# Patient Record
Sex: Female | Born: 1965 | Race: White | Hispanic: No | Marital: Married | Smoking: Never smoker
Health system: Southern US, Community
[De-identification: ages and names within clinical notes are randomized; demographics above are authoritative.]

## PROBLEM LIST (undated history)

## (undated) DIAGNOSIS — C210 Malignant neoplasm of anus, unspecified: Secondary | ICD-10-CM

## (undated) DIAGNOSIS — C50919 Malignant neoplasm of unspecified site of unspecified female breast: Secondary | ICD-10-CM

## (undated) HISTORY — PX: OTHER SURGICAL HISTORY: SHX169

## (undated) HISTORY — DX: Malignant neoplasm of unspecified site of unspecified female breast: C50.919

## (undated) HISTORY — DX: Malignant neoplasm of anus, unspecified: C21.0

## (undated) HISTORY — PX: MASTECTOMY: SHX3

---

## 2005-11-02 DIAGNOSIS — N83209 Unspecified ovarian cyst, unspecified side: Secondary | ICD-10-CM | POA: Insufficient documentation

## 2005-11-02 DIAGNOSIS — M792 Neuralgia and neuritis, unspecified: Secondary | ICD-10-CM | POA: Insufficient documentation

## 2005-11-02 DIAGNOSIS — B081 Molluscum contagiosum: Secondary | ICD-10-CM | POA: Insufficient documentation

## 2006-04-26 DIAGNOSIS — C50919 Malignant neoplasm of unspecified site of unspecified female breast: Secondary | ICD-10-CM | POA: Insufficient documentation

## 2009-01-17 DIAGNOSIS — F419 Anxiety disorder, unspecified: Secondary | ICD-10-CM | POA: Insufficient documentation

## 2009-01-17 DIAGNOSIS — F32A Depression, unspecified: Secondary | ICD-10-CM | POA: Insufficient documentation

## 2009-04-17 DIAGNOSIS — M779 Enthesopathy, unspecified: Secondary | ICD-10-CM | POA: Insufficient documentation

## 2011-06-18 DIAGNOSIS — R87611 Atypical squamous cells cannot exclude high grade squamous intraepithelial lesion on cytologic smear of cervix (ASC-H): Secondary | ICD-10-CM | POA: Insufficient documentation

## 2012-09-22 DIAGNOSIS — L309 Dermatitis, unspecified: Secondary | ICD-10-CM | POA: Insufficient documentation

## 2014-03-22 DIAGNOSIS — Z9011 Acquired absence of right breast and nipple: Secondary | ICD-10-CM | POA: Insufficient documentation

## 2014-03-22 DIAGNOSIS — Z9882 Breast implant status: Secondary | ICD-10-CM | POA: Insufficient documentation

## 2016-03-12 DIAGNOSIS — L409 Psoriasis, unspecified: Secondary | ICD-10-CM | POA: Insufficient documentation

## 2017-09-23 DIAGNOSIS — B351 Tinea unguium: Secondary | ICD-10-CM | POA: Insufficient documentation

## 2020-08-05 DIAGNOSIS — H5712 Ocular pain, left eye: Secondary | ICD-10-CM | POA: Insufficient documentation

## 2020-08-05 DIAGNOSIS — B309 Viral conjunctivitis, unspecified: Secondary | ICD-10-CM | POA: Insufficient documentation

## 2021-01-05 DIAGNOSIS — C4492 Squamous cell carcinoma of skin, unspecified: Secondary | ICD-10-CM | POA: Insufficient documentation

## 2021-01-20 DIAGNOSIS — C210 Malignant neoplasm of anus, unspecified: Secondary | ICD-10-CM | POA: Insufficient documentation

## 2021-05-08 ENCOUNTER — Ambulatory Visit
Admission: RE | Admit: 2021-05-08 | Discharge: 2021-05-08 | Disposition: A | Discharge status: Home / Self Care | Payer: Self-pay | Source: Ambulatory Visit | Attending: Internal Medicine | Admitting: Internal Medicine

## 2021-05-08 ENCOUNTER — Encounter: Payer: Self-pay | Admitting: Internal Medicine

## 2021-05-08 ENCOUNTER — Other Ambulatory Visit: Payer: Self-pay

## 2021-05-08 ENCOUNTER — Inpatient Hospital Stay: Payer: BC Managed Care – PPO | Attending: Internal Medicine | Admitting: Internal Medicine

## 2021-05-08 ENCOUNTER — Inpatient Hospital Stay: Payer: BC Managed Care – PPO

## 2021-05-08 VITALS — BP 117/79 | HR 76 | Temp 99.7°F | Resp 18 | Wt 129.2 lb

## 2021-05-08 DIAGNOSIS — C210 Malignant neoplasm of anus, unspecified: Secondary | ICD-10-CM

## 2021-05-08 DIAGNOSIS — Z853 Personal history of malignant neoplasm of breast: Secondary | ICD-10-CM | POA: Diagnosis not present

## 2021-05-08 LAB — COMPREHENSIVE METABOLIC PANEL
ALT: 24 U/L (ref 0–44)
AST: 20 U/L (ref 15–41)
Albumin: 4 g/dL (ref 3.5–5.0)
Alkaline Phosphatase: 76 U/L (ref 38–126)
Anion gap: 7 (ref 5–15)
BUN: 15 mg/dL (ref 6–20)
CO2: 30 mmol/L (ref 22–32)
Calcium: 8.9 mg/dL (ref 8.9–10.3)
Chloride: 98 mmol/L (ref 98–111)
Creatinine, Ser: 0.44 mg/dL (ref 0.44–1.00)
GFR, Estimated: >60 mL/min (ref >60)
Glucose, Bld: 94 mg/dL (ref 70–99)
Potassium: 4.3 mmol/L (ref 3.5–5.1)
Sodium: 135 mmol/L (ref 135–145)
Total Bilirubin: 0.5 mg/dL (ref 0.3–1.2)
Total Protein: 7.5 g/dL (ref 6.5–8.1)

## 2021-05-08 LAB — CBC WITH DIFFERENTIAL/PLATELET
Abs Immature Granulocytes: 0.03 10*3/uL (ref 0.00–0.07)
Basophils Absolute: 0 10*3/uL (ref 0.0–0.1)
Basophils Relative: 1 %
Eosinophils Absolute: 0.3 10*3/uL (ref 0.0–0.5)
Eosinophils Relative: 6 %
HCT: 36.5 % (ref 36.0–46.0)
Hemoglobin: 12 g/dL (ref 12.0–15.0)
Immature Granulocytes: 1 %
Lymphocytes Relative: 13 %
Lymphs Abs: 0.6 10*3/uL — ABNORMAL LOW (ref 0.7–4.0)
MCH: 31.8 pg (ref 26.0–34.0)
MCHC: 32.9 g/dL (ref 30.0–36.0)
MCV: 96.8 fL (ref 80.0–100.0)
Monocytes Absolute: 1.2 10*3/uL — ABNORMAL HIGH (ref 0.1–1.0)
Monocytes Relative: 24 %
Neutro Abs: 2.7 10*3/uL (ref 1.7–7.7)
Neutrophils Relative %: 55 %
Platelets: 273 10*3/uL (ref 150–400)
RBC: 3.77 MIL/uL — ABNORMAL LOW (ref 3.87–5.11)
RDW: 17 % — ABNORMAL HIGH (ref 11.5–15.5)
WBC: 4.9 10*3/uL (ref 4.0–10.5)
nRBC: 0 % (ref 0.0–0.2)

## 2021-05-08 MED ORDER — PHENAZOPYRIDINE HCL 100 MG PO TABS: 100.0000 mg | ORAL_TABLET | Freq: Three times a day (TID) | ORAL | 1 refills | Status: DC

## 2021-05-08 MED ORDER — OXYCODONE HCL 5 MG PO TABS: 10.0000 mg | ORAL_TABLET | Freq: Every evening | ORAL | 0 refills | Status: DC

## 2021-05-08 MED ORDER — TRIAMCINOLONE ACETONIDE 0.5 % EX OINT: 1.0000 "application " | TOPICAL_OINTMENT | Freq: Two times a day (BID) | CUTANEOUS | 0 refills | Status: DC

## 2021-05-08 NOTE — Progress Notes (Signed)
Patient here to establish care for ana cancer. Pt recently moved from [place]. She completed radiation about 2 weeks ago and repots having painful itching to genital area.

## 2021-05-08 NOTE — Assessment & Plan Note (Addendum)
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  Finished radiation on Oct, 11th, 2022.  #As per history patient tolerated chemotherapy radiation-with expected side effects [see below]-no interruptions in therapy noted.  No hospitalizations noted.  Discussed that we will plan to get a PET scan about 3 months post radiation.  We will plan to get PET scan mid January 2022.  We will order PET scan next visit.   # Radiation dermatitis- G-3; continue aquaphor [allergy to Sulfa]; continue oxyccodne 10 mg nightly new prescription sent.  Also recommend Kenalog ointment twice a day; also recommend pyridium for dysuria.  #History of right breast cancer [2007-@ 39y;]- stage I ER/PR- her- 2- NEG; s/p mastecmy- no RT. no clinical evidence of recurrence.  BRCA negative as per patient; discuss genetics.  # Throid nodule-6 mm incidental [PET aug 2022]-await repeat PET imaging as above/consider biopsy.  #Port flush-at next visit; declines Emla cream.  # DISPOSITION: # labs- cbc/cmp today # follow up in 3-4 weeks- MD; labs- cbc/cmp/port flush- Dr.B  # 60 minutes face-to-face with the patient discussing the above plan of care; more than 50% of time spent on prognosis/ natural history; counseling and coordination.

## 2021-05-08 NOTE — Progress Notes (Signed)
[facility] OFFICE PROGRESS NOTE  No care team member to display  Cancer Staging No matching staging information was found for the patient.   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  Oncotype recurrence score 18; April 2007-tamoxifen plus Lupron.;  March 2008-CIN-1-total abdominal hysterectomy and bilateral salpingo-oophorectomy plus appendectomy.;  February 2008-Lupron discontinued; continue tamoxifen.  June 2017-2019-anastrozole; October 2019 stopped anastrozole-declining bone density.   Anal cancer (HCC)  05/08/2021 Initial Diagnosis   Anal cancer (HCC)      HPI:    Yolanda Huerta 55 y.o.  female pleasant patient above history of  newly diagnosed anal cancer is here to establish care.  Patient found a lump in the left groin July August 2022.  She went further evaluation with PET scan/biopsy-pathology as above.  Patient underwent concurrent chemoradiation as summarized above.  Patient finished radiation OCT 11th, 2022-.  Patient states that she worked through her treatments.  Complains of significant pain in the perineal region.  Difficulty with urination.  No flank pain or fevers.  Intermittent blood in stools.   Review of Systems  Constitutional:  Positive for malaise/fatigue. Negative for chills, diaphoresis, fever and weight loss.  HENT:  Negative for nosebleeds and sore throat.   Eyes:  Negative for double vision.  Respiratory:  Negative for cough, hemoptysis, sputum production, shortness of breath and  wheezing.   Cardiovascular:  Negative for chest pain, palpitations, orthopnea and leg swelling.  Gastrointestinal:  Positive for blood in stool. Negative for abdominal pain, constipation, diarrhea, heartburn, melena, nausea and vomiting.  Genitourinary:  Positive for dysuria, frequency and urgency.  Musculoskeletal:  Negative for back pain and joint pain.  Skin: Negative.  Negative for itching and rash.  Neurological:  Negative for dizziness, tingling, focal weakness, weakness and headaches.  Endo/Heme/Allergies:  Does not bruise/bleed easily.  Psychiatric/Behavioral:  Negative for depression. The patient is not nervous/anxious and does not have insomnia.      PAST MEDICAL HISTORY :  Past Medical History:  Diagnosis Date   Breast cancer (HCC)    age 39    PAST SURGICAL HISTORY :   Past Surgical History:  Procedure Laterality Date   complete hysterectomy     right mastectomy      FAMILY HISTORY :   Family History  Problem Relation Age of Onset   Hypertension Mother    Cancer Father        laryngeal cancer   Cancer Maternal Aunt        GYN   Colon cancer Maternal Aunt     SOCIAL HISTORY:   Social History   Tobacco Use   Smoking status: Never    Passive exposure: Never   Smokeless tobacco: Never  Vaping Use   Vaping Use: Never used  Substance Use Topics   Alcohol use: Not Currently   Drug use: Never    ALLERGIES:  has no allergies on file.  MEDICATIONS:  Current Outpatient Medications  Medication Sig Dispense Refill   cholecalciferol (VITAMIN D3) 25 MCG (1000 UNIT) tablet Take 1,000 Units by mouth daily.     pentoxifylline (TRENTAL) 400 MG CR tablet Take 400 mg   by mouth 3 (three) times daily.     phenazopyridine (PYRIDIUM) 100 MG tablet Take 1 tablet (100 mg total) by mouth 3 (three) times daily with meals. 30 tablet 1   triamcinolone ointment (KENALOG) 0.5 % Apply 1 application topically 2 (two) times daily. 30 g 0   venlafaxine (EFFEXOR) 75 MG tablet Take 75  mg by mouth daily.     Vitamin E 270 MG (600 UNIT) CAPS Take 3 capsules by mouth daily.     naloxone (NARCAN) nasal spray 4 mg/0.1 mL SMARTSIG:Both Nares (Patient not taking: Reported on 05/08/2021)     oxyCODONE (OXY IR/ROXICODONE) 5 MG immediate release tablet Take 2 tablets (10 mg total) by mouth at bedtime. 40 tablet 0   No current facility-administered medications for this visit.    PHYSICAL EXAMINATION: ECOG PERFORMANCE STATUS: 1 - Symptomatic but completely ambulatory  BP 117/79 (BP Location: Left Arm, Patient Position: Sitting)   Pulse 76   Temp 99.7 F (37.6 C)   Resp 18   Wt 129 lb 3.2 oz (58.6 kg)   Filed Weights   05/08/21 1406  Weight: 129 lb 3.2 oz (58.6 kg)   Examined presence of chaperone.  Skin exam/perineum-significant desquamation of the skin secondary radiation.  No signs of infection noted.  Physical Exam Vitals and nursing note reviewed.  HENT:     Head: Normocephalic and atraumatic.     Mouth/Throat:     Pharynx: Oropharynx is clear.  Eyes:     Extraocular Movements: Extraocular movements intact.     Pupils: Pupils are equal, round, and reactive to light.  Cardiovascular:     Rate and Rhythm: Normal rate and regular rhythm.  Abdominal:     Palpations: Abdomen is soft.  Musculoskeletal:        General: Normal range of motion.     Cervical back: Normal range of motion.  Skin:    General: Skin is warm.  Neurological:     General: No focal deficit present.     Mental Status: She is alert and oriented to person, place, and time.  Psychiatric:        Behavior: Behavior normal.        Judgment: Judgment normal.       LABORATORY DATA:  I have reviewed the data as listed No results found for: NA, K, CL, CO2, GLUCOSE, BUN, CREATININE, CALCIUM, PROT, ALBUMIN, AST, ALT, ALKPHOS, BILITOT, GFRNONAA, GFRAA  No results found for: SPEP, UPEP  No results found for: WBC, NEUTROABS, HGB, HCT, MCV, PLT    Chemistry   No results found for: NA, K, CL,  CO2, BUN, CREATININE, GLU No results found for: CALCIUM, ALKPHOS, AST, ALT, BILITOT     RADIOGRAPHIC STUDIES: I have personally reviewed the radiological images as listed and agreed with the findings in the report. No results found.   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  Finished radiation on Oct, 11th, 2022.  #As per history patient tolerated chemotherapy radiation-with expected side effects [see below]-no interruptions in therapy noted.  No hospitalizations noted.  Discussed that we will plan to get a PET scan about 3 months post radiation.  We will plan to get PET scan mid January 2022.  We will order PET scan next visit.   # Radiation dermatitis- G-3; continue aquaphor [allergy to Sulfa]; continue oxyccodne 10 mg nightly new prescription sent.  Also recommend   Kenalog ointment twice a day; also recommend pyridium for dysuria.  #History of right breast cancer [2007-@ 39y;]- stage I ER/PR- her- 2- NEG; s/p mastecmy- no RT. no clinical evidence of recurrence.  BRCA negative as per patient; discuss genetics.  # Throid nodule-6 mm incidental [PET aug 2022]-await repeat PET imaging as above/consider biopsy.  #Port flush-at next visit; declines Emla cream.  # DISPOSITION: # labs- cbc/cmp today # follow up in 3-4 weeks- MD; labs- cbc/cmp/port flush- Dr.B  # 60 minutes face-to-face with the patient discussing the above plan of care; more than 50% of time spent on prognosis/ natural history; counseling and coordination.   Orders Placed This Encounter  Procedures   CBC with Differential/Platelet    Standing Status:   Future    Number of Occurrences:   1    Standing Expiration Date:   05/08/2022   Comprehensive metabolic panel    Standing Status:   Future    Number of Occurrences:   1    Standing Expiration Date:   05/08/2022   CBC with  Differential/Platelet    Standing Status:   Future    Standing Expiration Date:   05/08/2022   Comprehensive metabolic panel    Standing Status:   Future    Standing Expiration Date:   05/08/2022   All questions were answered. The patient knows to call the clinic with any problems, questions or concerns.      Govinda R Brahmanday, MD 05/08/2021 3:19 PM   

## 2021-05-08 NOTE — Addendum Note (Signed)
Addended by: Vanice Sarah on: 05/08/2021 03:27 PM   Modules accepted: Orders

## 2021-05-11 ENCOUNTER — Other Ambulatory Visit: Payer: Self-pay | Admitting: *Deleted

## 2021-05-11 MED ORDER — PHENAZOPYRIDINE HCL 100 MG PO TABS: 100.0000 mg | ORAL_TABLET | Freq: Three times a day (TID) | ORAL | 1 refills | Status: AC

## 2021-05-11 MED ORDER — OXYCODONE HCL 5 MG PO TABS: 10.0000 mg | ORAL_TABLET | Freq: Every evening | ORAL | 0 refills | Status: DC

## 2021-05-11 MED ORDER — TRIAMCINOLONE ACETONIDE 0.5 % EX OINT: 1.0000 "application " | TOPICAL_OINTMENT | Freq: Two times a day (BID) | CUTANEOUS | 0 refills | Status: AC

## 2021-05-11 NOTE — Telephone Encounter (Signed)
Patient called and stated that the pharmacy did not receive any of these scripts-kenalog, pyridium and oxycodone.  Verified with pharmacy-that scripts were never rcvd.  Dr. B- pls resend scripts.

## 2021-05-29 ENCOUNTER — Inpatient Hospital Stay: Payer: BC Managed Care – PPO | Admitting: Internal Medicine

## 2021-05-29 ENCOUNTER — Inpatient Hospital Stay: Payer: BC Managed Care – PPO | Attending: Internal Medicine

## 2021-05-29 NOTE — Assessment & Plan Note (Deleted)
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  Finished radiation on Oct, 11th, 2022.  #As per history patient tolerated chemotherapy radiation-with expected side effects [see below]-no interruptions in therapy noted.  No hospitalizations noted.  Discussed that we will plan to get a PET scan about 3 months post radiation.  We will plan to get PET scan mid January 2022.  We will order PET scan next visit.   # Radiation dermatitis- G-3; continue aquaphor [allergy to Sulfa]; continue oxyccodne 10 mg nightly new prescription sent.  Also recommend Kenalog ointment twice a day; also recommend pyridium for dysuria.  #History of right breast cancer [2007-@ 39y;]- stage I ER/PR- her- 2- NEG; s/p mastecmy- no RT. no clinical evidence of recurrence.  BRCA negative as per patient; discuss genetics.  # Throid nodule-6 mm incidental [PET aug 2022]-await repeat PET imaging as above/consider biopsy.  #Port flush-at next visit; declines Emla cream.  # DISPOSITION: # labs- cbc/cmp today # follow up in 3-4 weeks- MD; labs- cbc/cmp/port flush- Dr.B  # 60 minutes face-to-face with the patient discussing the above plan of care; more than 50% of time spent on prognosis/ natural history; counseling and coordination.

## 2021-05-29 NOTE — Progress Notes (Deleted)
[place] [facility] OFFICE PROGRESS NOTE  No care team member to display   Cancer Staging  No matching staging information was found for the patient.   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  Oncotype recurrence score 18; April 2007-tamoxifen plus Lupron.;  March 2008-CIN-1-total abdominal hysterectomy and bilateral salpingo-oophorectomy plus appendectomy.;  February 2008-Lupron discontinued; continue tamoxifen.  June 2017-2019-anastrozole; October 2019 stopped anastrozole-declining bone density.   Anal cancer ([place])  05/08/2021 Initial Diagnosis   Anal cancer ([place])      HPI:    Yolanda Huerta 55 y.o.  female pleasant patient above history of  newly diagnosed anal cancer is here to establish care.  Patient found a lump in the left groin July August 2022.  She went further evaluation with PET scan/biopsy-pathology as above.  Patient underwent concurrent chemoradiation as summarized above.  Patient finished radiation OCT 11th, 2022-.  Patient states that she worked through her treatments.  Complains of significant pain in the perineal region.  Difficulty with urination.  No flank pain or fevers.  Intermittent blood in stools.   Review of Systems  Constitutional:  Positive for malaise/fatigue. Negative for chills, diaphoresis, fever and weight loss.  HENT:  Negative for nosebleeds and sore throat.   Eyes:  Negative for double vision.  Respiratory:  Negative for cough, hemoptysis, sputum production, shortness of breath and  wheezing.   Cardiovascular:  Negative for chest pain, palpitations, orthopnea and leg swelling.  Gastrointestinal:  Positive for blood in stool. Negative for abdominal pain, constipation, diarrhea, heartburn, melena, nausea and vomiting.  Genitourinary:  Positive for dysuria, frequency and urgency.  Musculoskeletal:  Negative for back pain and joint pain.  Skin: Negative.  Negative for itching and rash.  Neurological:  Negative for dizziness, tingling, focal weakness, weakness and headaches.  Endo/Heme/Allergies:  Does not bruise/bleed easily.  Psychiatric/Behavioral:  Negative for depression. The patient is not nervous/anxious and does not have insomnia.      PAST MEDICAL HISTORY :  Past Medical History:  Diagnosis Date  . Breast cancer [facility])    age 56    PAST SURGICAL HISTORY :   Past Surgical History:  Procedure Laterality Date  . complete hysterectomy    . right mastectomy      FAMILY HISTORY :   Family History  Problem Relation Age of Onset  . Hypertension Mother   . Cancer Father        laryngeal cancer  . Cancer Maternal Aunt        GYN  . Colon cancer Maternal Aunt     SOCIAL HISTORY:   Social History   Tobacco Use  . Smoking status: Never    Passive exposure: Never  . Smokeless tobacco: Never  Vaping Use  . Vaping Use: Never used  Substance Use Topics  . Alcohol use: Not Currently  . Drug use: Never    ALLERGIES:  has no allergies on file.  MEDICATIONS:  Current Outpatient Medications  Medication Sig Dispense Refill  . cholecalciferol (VITAMIN D3) 25 MCG (1000 UNIT) tablet Take 1,000 Units by mouth daily.    . naloxone (NARCAN) nasal spray 4 mg/0.1 mL  SMARTSIG:Both Nares (Patient not taking: Reported on 05/08/2021)    . oxyCODONE (OXY IR/ROXICODONE) 5 MG immediate release tablet Take 2 tablets (10 mg total) by mouth at bedtime. 40 tablet 0  . pentoxifylline (TRENTAL) 400 MG CR tablet Take 400 mg by mouth 3 (three) times daily.    . phenazopyridine  (PYRIDIUM) 100 MG tablet Take 1 tablet (100 mg total) by mouth 3 (three) times daily with meals. 30 tablet 1  . triamcinolone ointment (KENALOG) 0.5 % Apply 1 application topically 2 (two) times daily. 30 g 0  . venlafaxine (EFFEXOR) 75 MG tablet Take 75 mg by mouth daily.    . Vitamin E 270 MG (600 UNIT) CAPS Take 3 capsules by mouth daily.     No current facility-administered medications for this visit.    PHYSICAL EXAMINATION: ECOG PERFORMANCE STATUS: 1 - Symptomatic but completely ambulatory  There were no vitals taken for this visit.  There were no vitals filed for this visit.  Examined presence of chaperone.  Skin exam/perineum-significant desquamation of the skin secondary radiation.  No signs of infection noted.  Physical Exam Vitals and nursing note reviewed.  HENT:     Head: Normocephalic and atraumatic.     Mouth/Throat:     Pharynx: Oropharynx is clear.  Eyes:     Extraocular Movements: Extraocular movements intact.     Pupils: Pupils are equal, round, and reactive to light.  Cardiovascular:     Rate and Rhythm: Normal rate and regular rhythm.  Abdominal:     Palpations: Abdomen is soft.  Musculoskeletal:        General: Normal range of motion.     Cervical back: Normal range of motion.  Skin:    General: Skin is warm.  Neurological:     General: No focal deficit present.     Mental Status: She is alert and oriented to person, place, and time.  Psychiatric:        Behavior: Behavior normal.        Judgment: Judgment normal.       LABORATORY DATA:  I have reviewed the data as listed    Component Value Date/Time   NA 135 05/08/2021 1512   K 4.3 05/08/2021 1512   CL 98 05/08/2021 1512   CO2 30 05/08/2021 1512   GLUCOSE 94 05/08/2021 1512   BUN 15 05/08/2021 1512   CREATININE 0.44 05/08/2021 1512   CALCIUM 8.9 05/08/2021 1512   PROT 7.5 05/08/2021 1512   ALBUMIN 4.0 05/08/2021 1512   AST 20 05/08/2021 1512   ALT 24 05/08/2021 1512   ALKPHOS 76  05/08/2021 1512   BILITOT 0.5 05/08/2021 1512   GFRNONAA >60 05/08/2021 1512    No results found for: SPEP, UPEP  Lab Results  Component Value Date   WBC 4.9 05/08/2021   NEUTROABS 2.7 05/08/2021   HGB 12.0 05/08/2021   HCT 36.5 05/08/2021   MCV 96.8 05/08/2021   PLT 273 05/08/2021      Chemistry      Component Value Date/Time   NA 135 05/08/2021 1512   K 4.3 05/08/2021 1512   CL 98 05/08/2021 1512   CO2 30 05/08/2021 1512   BUN 15 05/08/2021 1512   CREATININE 0.44 05/08/2021 1512      Component Value Date/Time   CALCIUM 8.9 05/08/2021 1512   ALKPHOS 76 05/08/2021 1512   AST 20 05/08/2021 1512   ALT 24 05/08/2021 1512   BILITOT 0.5 05/08/2021 1512  RADIOGRAPHIC STUDIES: I have personally reviewed the radiological images as listed and agreed with the findings in the report. No results found.   ASSESSMENT & PLAN:  No problem-specific Assessment & Plan notes found for this encounter.   No orders of the defined types were placed in this encounter.  All questions were answered. The patient knows to call the clinic with any problems, questions or concerns.      Cammie Sickle, MD 05/29/2021 1:59 PM

## 2021-06-01 ENCOUNTER — Telehealth: Payer: Self-pay | Admitting: Internal Medicine

## 2021-06-01 NOTE — Telephone Encounter (Signed)
Pt called to set up an appt. Please give her a call back at [phone]

## 2021-06-01 NOTE — Telephone Encounter (Signed)
LVM for pt to call me back.

## 2021-06-09 NOTE — Telephone Encounter (Signed)
Patient left voicemail requesting a call to r/s missed appts.

## 2021-07-10 ENCOUNTER — Other Ambulatory Visit: Payer: Self-pay

## 2021-07-10 ENCOUNTER — Encounter: Payer: Self-pay | Admitting: Internal Medicine

## 2021-07-10 ENCOUNTER — Inpatient Hospital Stay: Payer: BC Managed Care – PPO | Attending: Internal Medicine

## 2021-07-10 ENCOUNTER — Inpatient Hospital Stay (HOSPITAL_BASED_OUTPATIENT_CLINIC_OR_DEPARTMENT_OTHER): Payer: BC Managed Care – PPO | Admitting: Internal Medicine

## 2021-07-10 DIAGNOSIS — Z9079 Acquired absence of other genital organ(s): Secondary | ICD-10-CM | POA: Diagnosis not present

## 2021-07-10 DIAGNOSIS — Z90710 Acquired absence of both cervix and uterus: Secondary | ICD-10-CM | POA: Insufficient documentation

## 2021-07-10 DIAGNOSIS — Z452 Encounter for adjustment and management of vascular access device: Secondary | ICD-10-CM | POA: Diagnosis present

## 2021-07-10 DIAGNOSIS — Z923 Personal history of irradiation: Secondary | ICD-10-CM | POA: Insufficient documentation

## 2021-07-10 DIAGNOSIS — C210 Malignant neoplasm of anus, unspecified: Secondary | ICD-10-CM | POA: Insufficient documentation

## 2021-07-10 DIAGNOSIS — Z9221 Personal history of antineoplastic chemotherapy: Secondary | ICD-10-CM | POA: Insufficient documentation

## 2021-07-10 DIAGNOSIS — Z90722 Acquired absence of ovaries, bilateral: Secondary | ICD-10-CM | POA: Diagnosis not present

## 2021-07-10 LAB — COMPREHENSIVE METABOLIC PANEL
ALT: 21 U/L (ref 0–44)
AST: 26 U/L (ref 15–41)
Albumin: 3.7 g/dL (ref 3.5–5.0)
Alkaline Phosphatase: 61 U/L (ref 38–126)
Anion gap: 7 (ref 5–15)
BUN: 18 mg/dL (ref 6–20)
CO2: 30 mmol/L (ref 22–32)
Calcium: 8.6 mg/dL — ABNORMAL LOW (ref 8.9–10.3)
Chloride: 101 mmol/L (ref 98–111)
Creatinine, Ser: 0.52 mg/dL (ref 0.44–1.00)
GFR, Estimated: >60 mL/min (ref >60)
Glucose, Bld: 93 mg/dL (ref 70–99)
Potassium: 3.8 mmol/L (ref 3.5–5.1)
Sodium: 138 mmol/L (ref 135–145)
Total Bilirubin: 0.6 mg/dL (ref 0.3–1.2)
Total Protein: 6.6 g/dL (ref 6.5–8.1)

## 2021-07-10 LAB — CBC WITH DIFFERENTIAL/PLATELET
Abs Immature Granulocytes: 0.01 10*3/uL (ref 0.00–0.07)
Basophils Absolute: 0 10*3/uL (ref 0.0–0.1)
Basophils Relative: 1 %
Eosinophils Absolute: 0.1 10*3/uL (ref 0.0–0.5)
Eosinophils Relative: 4 %
HCT: 38.1 % (ref 36.0–46.0)
Hemoglobin: 12.3 g/dL (ref 12.0–15.0)
Immature Granulocytes: 0 %
Lymphocytes Relative: 27 %
Lymphs Abs: 0.8 10*3/uL (ref 0.7–4.0)
MCH: 32.4 pg (ref 26.0–34.0)
MCHC: 32.3 g/dL (ref 30.0–36.0)
MCV: 100.3 fL — ABNORMAL HIGH (ref 80.0–100.0)
Monocytes Absolute: 0.5 10*3/uL (ref 0.1–1.0)
Monocytes Relative: 17 %
Neutro Abs: 1.5 10*3/uL — ABNORMAL LOW (ref 1.7–7.7)
Neutrophils Relative %: 51 %
Platelets: 202 10*3/uL (ref 150–400)
RBC: 3.8 MIL/uL — ABNORMAL LOW (ref 3.87–5.11)
RDW: 14.6 % (ref 11.5–15.5)
WBC: 2.9 10*3/uL — ABNORMAL LOW (ref 4.0–10.5)
nRBC: 0 % (ref 0.0–0.2)

## 2021-07-10 MED ORDER — HEPARIN SOD (PORK) LOCK FLUSH 100 UNIT/ML IV SOLN
500.0000 [IU] | Freq: Once | INTRAVENOUS | Status: AC
  Administered 2021-07-10: 11:00:00 500 [IU] via INTRAVENOUS
  Filled 2021-07-10: qty 5

## 2021-07-10 MED ORDER — SODIUM CHLORIDE 0.9% FLUSH
10.0000 mL | Freq: Once | INTRAVENOUS | Status: AC
  Administered 2021-07-10: 11:00:00 10 mL via INTRAVENOUS
  Filled 2021-07-10: qty 10

## 2021-07-10 NOTE — Progress Notes (Signed)
[facility] OFFICE PROGRESS NOTE  No care team member to display   Cancer Staging  No matching staging information was found for the patient.   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  Oncotype recurrence score 18; April 2007-tamoxifen plus Lupron.;  March 2008-CIN-1-total abdominal hysterectomy and bilateral salpingo-oophorectomy plus appendectomy.;  February 2008-Lupron discontinued; continue tamoxifen.  June 2017-2019-anastrozole; October 2019 stopped anastrozole-declining bone density.   Anal cancer ([place])  05/08/2021 Initial Diagnosis   Anal cancer ([place])      HPI: Alone.  Walking independently  Yolanda Huerta 55 y.o.  female pleasant patient above history of  anal cancer stage III status post chemoradiation is here for follow-up  Patient finished radiation OCT 11th, 2022-in [place].  Patient also has significant improvement of her diarrhea.  However continues to have intermittent loose stools.  Also has significant improvement of the pain from radiation in the perineal region.  Intermittent difficulty with urination ; however significant improved.  Complains of painful coitus.    Review of Systems  Constitutional:  Positive for malaise/fatigue. Negative for chills, diaphoresis, fever and weight loss.  HENT:  Negative for nosebleeds and sore throat.   Eyes:  Negative for double vision.  Respiratory:  Negative for cough, hemoptysis, sputum production, shortness of breath and wheezing.   Cardiovascular:  Negative  for chest pain, palpitations, orthopnea and leg swelling.  Gastrointestinal:  Negative for abdominal pain, constipation, diarrhea, heartburn, melena, nausea and vomiting.  Genitourinary:  Positive for dysuria.  Musculoskeletal:  Negative for back pain and joint pain.  Skin: Negative.  Negative for itching and rash.  Neurological:  Negative for dizziness, tingling, focal weakness, weakness and headaches.  Endo/Heme/Allergies:  Does not bruise/bleed easily.  Psychiatric/Behavioral:  Negative for depression. The patient is not nervous/anxious and does not have insomnia.      PAST MEDICAL HISTORY :  Past Medical History:  Diagnosis Date   Breast cancer [facility])    age 25    PAST SURGICAL HISTORY :   Past Surgical History:  Procedure Laterality Date   complete hysterectomy     right mastectomy      FAMILY HISTORY :   Family History  Problem Relation Age of Onset   Hypertension Mother    Cancer Father        laryngeal cancer   Cancer Maternal Aunt        GYN   Colon cancer Maternal Aunt     SOCIAL HISTORY:   Social History   Tobacco Use   Smoking status: Never    Passive exposure: Never   Smokeless tobacco: Never  Vaping Use   Vaping Use: Never used  Substance Use Topics   Alcohol use: Not Currently   Drug use: Never    ALLERGIES:  is allergic to sulfa antibiotics.  MEDICATIONS:  Current Outpatient Medications  Medication Sig Dispense Refill   cholecalciferol (VITAMIN D3) 25 MCG (1000 UNIT) tablet Take 1,000 Units by mouth daily.     venlafaxine (EFFEXOR) 75 MG tablet Take 75 mg by mouth daily.     Vitamin E 270 MG (600 UNIT) CAPS Take 3 capsules by mouth  daily.     pentoxifylline (TRENTAL) 400 MG CR tablet Take 400 mg by mouth 3 (three) times daily. (Patient not taking: Reported on 07/10/2021)     phenazopyridine (PYRIDIUM) 100 MG tablet Take 1 tablet (100 mg total) by mouth 3 (three) times daily with meals. (Patient not taking: Reported on 07/10/2021) 30 tablet 1    triamcinolone ointment (KENALOG) 0.5 % Apply 1 application topically 2 (two) times daily. (Patient not taking: Reported on 07/10/2021) 30 g 0   No current facility-administered medications for this visit.    PHYSICAL EXAMINATION: ECOG PERFORMANCE STATUS: 1 - Symptomatic but completely ambulatory  BP 110/83 (BP Location: Left Arm, Patient Position: Sitting, Cuff Size: Normal)    Pulse 72    Temp 97.8 F (36.6 C) (Tympanic)    Wt 136 lb 3.2 oz (61.8 kg)    SpO2 99%   Filed Weights   07/10/21 0956  Weight: 136 lb 3.2 oz (61.8 kg)    Physical Exam Vitals and nursing note reviewed.  HENT:     Head: Normocephalic and atraumatic.     Mouth/Throat:     Pharynx: Oropharynx is clear.  Eyes:     Extraocular Movements: Extraocular movements intact.     Pupils: Pupils are equal, round, and reactive to light.  Cardiovascular:     Rate and Rhythm: Normal rate and regular rhythm.  Abdominal:     Palpations: Abdomen is soft.  Musculoskeletal:        General: Normal range of motion.     Cervical back: Normal range of motion.  Skin:    General: Skin is warm.  Neurological:     General: No focal deficit present.     Mental Status: She is alert and oriented to person, place, and time.  Psychiatric:        Behavior: Behavior normal.        Judgment: Judgment normal.       LABORATORY DATA:  I have reviewed the data as listed    Component Value Date/Time   NA 138 07/10/2021 0944   K 3.8 07/10/2021 0944   CL 101 07/10/2021 0944   CO2 30 07/10/2021 0944   GLUCOSE 93 07/10/2021 0944   BUN 18 07/10/2021 0944   CREATININE 0.52 07/10/2021 0944   CALCIUM 8.6 (L) 07/10/2021 0944   PROT 6.6 07/10/2021 0944   ALBUMIN 3.7 07/10/2021 0944   AST 26 07/10/2021 0944   ALT 21 07/10/2021 0944   ALKPHOS 61 07/10/2021 0944   BILITOT 0.6 07/10/2021 0944   GFRNONAA >60 07/10/2021 0944    No results found for: SPEP, UPEP  Lab Results  Component Value Date   WBC 2.9 (L) 07/10/2021   NEUTROABS  1.5 (L) 07/10/2021   HGB 12.3 07/10/2021   HCT 38.1 07/10/2021   MCV 100.3 (H) 07/10/2021   PLT 202 07/10/2021      Chemistry      Component Value Date/Time   NA 138 07/10/2021 0944   K 3.8 07/10/2021 0944   CL 101 07/10/2021 0944   CO2 30 07/10/2021 0944   BUN 18 07/10/2021 0944   CREATININE 0.52 07/10/2021 0944      Component Value Date/Time   CALCIUM 8.6 (L) 07/10/2021 0944   ALKPHOS 61 07/10/2021 0944   AST 26 07/10/2021 0944   ALT 21 07/10/2021 0944   BILITOT 0.6 07/10/2021 0944       RADIOGRAPHIC STUDIES: I have personally reviewed the radiological images as listed and agreed with the  findings in the report. No results found.   ASSESSMENT & PLAN:  Anal cancer ([place]) #Anal cancer stage III [cT1cN1; [place]]-s/p concurrent chemoradiation with continuous 5-FU infusion plus mitomycin day 1 day 29.  Finished radiation on Oct, 11th, 2022.  # plan get PET scan in 3 weeks [mid to late JAN 2023.]; ie- PET scan about 3 months post radiation. Order PET scan today  # Radiation dermatitis- resolved.  STOP pyridium/trentral.   # Sexual dysfunction/painful coitus- recommend eva with [facility]- dsicussed with Lauren.   # Throid nodule-6 mm incidental [PET aug 2022]-await repeat PET imaging as above/consider biopsy.  #Port flush-today; declines Emla cream.  # DISPOSITION: # port flush today # follow up in 3- weeks- MD; PET scan proir- Dr.B     Orders Placed This Encounter  Procedures   NM PET Image Restage (PS) Skull Base to Thigh (F-18 FDG)    Standing Status:   Future    Standing Expiration Date:   07/10/2022    Order Specific Question:   If indicated for the ordered procedure, I authorize the administration of a radiopharmaceutical per Radiology protocol    Answer:   Yes    Order Specific Question:   Preferred imaging location?    Answer:   [facility]    Order Specific Question:   Is the patient pregnant?    Answer:   No   All questions were answered.  The patient knows to call the clinic with any problems, questions or concerns.      Cammie Sickle, MD 07/10/2021 11:22 AM

## 2021-07-10 NOTE — Assessment & Plan Note (Addendum)
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  Finished radiation on Oct, 11th, 2022.  # plan get PET scan in 3 weeks [mid to late JAN 2023.]; ie- PET scan about 3 months post radiation. Order PET scan today  # Radiation dermatitis- resolved.  STOP pyridium/trentral.   # Sexual dysfunction/painful coitus- recommend eva with [facility]- dsicussed with Lauren.   # Throid nodule-6 mm incidental [PET aug 2022]-await repeat PET imaging as above/consider biopsy.  #Port flush-today; declines Emla cream.  # DISPOSITION: # port flush today # follow up in 3- weeks- MD; PET scan proir- Dr.B

## 2021-07-16 ENCOUNTER — Telehealth: Payer: BC Managed Care – PPO | Admitting: Nurse Practitioner

## 2021-07-17 ENCOUNTER — Other Ambulatory Visit: Payer: Self-pay

## 2021-07-17 ENCOUNTER — Inpatient Hospital Stay (HOSPITAL_BASED_OUTPATIENT_CLINIC_OR_DEPARTMENT_OTHER): Payer: BC Managed Care – PPO | Admitting: Nurse Practitioner

## 2021-07-17 NOTE — Progress Notes (Signed)
Patient did not answer for appt. Will have scheduling contact her to reschedule.

## 2021-07-27 ENCOUNTER — Telehealth: Payer: Self-pay | Admitting: Internal Medicine

## 2021-07-27 NOTE — Telephone Encounter (Signed)
Pt called to reschedule her appt for 1-27. Call back at [phone]

## 2021-07-28 ENCOUNTER — Other Ambulatory Visit: Payer: Self-pay | Admitting: Internal Medicine

## 2021-07-28 ENCOUNTER — Telehealth: Payer: Self-pay

## 2021-07-28 ENCOUNTER — Telehealth: Payer: Self-pay | Admitting: Internal Medicine

## 2021-07-28 DIAGNOSIS — C210 Malignant neoplasm of anus, unspecified: Secondary | ICD-10-CM

## 2021-07-28 NOTE — Telephone Encounter (Signed)
Pt called to reschedule her appts on 1-27. Ahe needs a Friday appt. Call back at [phone]

## 2021-07-28 NOTE — Progress Notes (Signed)
Message on patient's voicemail as well as a MyChart message informing patient of the need to cancel PET and schedule CT.

## 2021-07-28 NOTE — Telephone Encounter (Signed)
Message on patient's voicemail informing her that insurance denied prior auth on PET scan with request for more recent CT scan.  PET has been cancelled.  Please schedule STAT CT as MD has ordered and inform patient of CT appt date.

## 2021-07-28 NOTE — Telephone Encounter (Signed)
My Chart message also sent to patient.

## 2021-07-28 NOTE — Progress Notes (Signed)
Spoke to P2P-recommend CT chest and pelvis with contrast-if inconclusive then would approve PET scan.  CT chest and pelvis ordered stat.  Please inform pt of above changes.   Thanks GB

## 2021-07-29 ENCOUNTER — Encounter: Payer: Self-pay | Admitting: Internal Medicine

## 2021-07-29 ENCOUNTER — Telehealth: Payer: Self-pay | Admitting: Internal Medicine

## 2021-07-29 ENCOUNTER — Other Ambulatory Visit: Payer: BC Managed Care – PPO

## 2021-07-29 NOTE — Telephone Encounter (Signed)
[place] insurance authorization department verified with insurance company that PET was denied and CT was approved.  Patient would like to speak with Dr. B regarding this matter.

## 2021-07-29 NOTE — Telephone Encounter (Signed)
Pt called and needs to get PET scan appt back for 1-19. There was an insurance problem that has been taken care of. Call back at [phone]- cell, or work -[phone] confirm appt.

## 2021-07-29 NOTE — Progress Notes (Signed)
I spoke to patient regarding her concerns for pet scan. Confirm with the patient that pet scan was not approved by insurance. If inconclusive on a CT scan would recommend a PET SCAN. And also discuss regarding valuation with [facility]surgery regarding endoscopy/biopsy if needed.  Regards of thyroid abnormality, I would discuss ultrasound/biopsy at next week.  Follow up with me after the CT scan As planned  GB

## 2021-07-30 ENCOUNTER — Ambulatory Visit
Admission: RE | Admit: 2021-07-30 | Discharge: 2021-07-30 | Disposition: A | Discharge status: Home / Self Care | Payer: BC Managed Care – PPO | Source: Ambulatory Visit | Attending: Internal Medicine | Admitting: Internal Medicine

## 2021-07-30 ENCOUNTER — Other Ambulatory Visit: Payer: Self-pay

## 2021-07-30 DIAGNOSIS — C210 Malignant neoplasm of anus, unspecified: Secondary | ICD-10-CM | POA: Diagnosis not present

## 2021-07-30 MED ORDER — IOHEXOL 300 MG/ML  SOLN
100.0000 mL | Freq: Once | INTRAMUSCULAR | Status: AC | PRN
  Administered 2021-07-30: 100 mL via INTRAVENOUS

## 2021-07-31 ENCOUNTER — Other Ambulatory Visit: Payer: Self-pay | Admitting: Internal Medicine

## 2021-07-31 ENCOUNTER — Ambulatory Visit: Payer: BC Managed Care – PPO | Admitting: Internal Medicine

## 2021-07-31 ENCOUNTER — Encounter: Payer: Self-pay | Admitting: Internal Medicine

## 2021-07-31 ENCOUNTER — Other Ambulatory Visit: Payer: Self-pay

## 2021-07-31 DIAGNOSIS — C210 Malignant neoplasm of anus, unspecified: Secondary | ICD-10-CM

## 2021-07-31 NOTE — Progress Notes (Signed)
Per Montel Culver Auth# [id number] valid 07/31/21-08/29/21 for [id number]

## 2021-07-31 NOTE — Progress Notes (Signed)
Ordered STAT PET scan. GB

## 2021-07-31 NOTE — Progress Notes (Signed)
Referral faxed to [facility] de[facility]. P: [phone] F: [phone]

## 2021-07-31 NOTE — Progress Notes (Signed)
GB-I ordered a stat PET scan-as her CT scan was inconclusive.  Also please make a referral to [facility] clinic surgery re: anal cancer/re-evaluation. Thanks

## 2021-07-31 NOTE — Progress Notes (Signed)
Spoke to patient regarding results of the CAT scan. Would recommend PET scan. Also discuss with Radiology.

## 2021-08-03 ENCOUNTER — Telehealth: Payer: Self-pay | Admitting: Internal Medicine

## 2021-08-03 NOTE — Telephone Encounter (Signed)
Pt called back and is requesting for the PET scan to be moved to March 2nd, 2023. Pt states she just started a new job and her employer is giving her a hard time. The patient doesn't want to lose her employment. She also wants her follow-up with Dr. B canceled on 1/27. Please advise on rescheduled and notify the patient of the changes.

## 2021-08-03 NOTE — Telephone Encounter (Signed)
Called patient to notify her that the PET scan has been scheduled for 08/05/2021 @ 10:30. Pt did not answer so VM was left with appt details. Pt was asked to call to confirm.

## 2021-08-04 NOTE — Progress Notes (Signed)
Called [facility] to confirm receipt of referral.  There referral was faxed to wrong dept but was transferred to the correct office ([phone]).  [facility] has left a message with patient to call their office to schedule appointment.

## 2021-08-07 ENCOUNTER — Other Ambulatory Visit: Payer: Self-pay

## 2021-08-07 ENCOUNTER — Ambulatory Visit: Payer: BC Managed Care – PPO | Admitting: Internal Medicine

## 2021-08-07 ENCOUNTER — Encounter
Admission: RE | Admit: 2021-08-07 | Discharge: 2021-08-07 | Disposition: A | Discharge status: Home / Self Care | Payer: BC Managed Care – PPO | Source: Ambulatory Visit | Attending: Internal Medicine | Admitting: Internal Medicine

## 2021-08-07 ENCOUNTER — Ambulatory Visit: Payer: BC Managed Care – PPO | Admitting: Nurse Practitioner

## 2021-08-07 DIAGNOSIS — C210 Malignant neoplasm of anus, unspecified: Secondary | ICD-10-CM | POA: Insufficient documentation

## 2021-08-07 LAB — GLUCOSE, CAPILLARY: Glucose-Capillary: 88 mg/dL (ref 70–99)

## 2021-08-07 MED ORDER — FLUDEOXYGLUCOSE F - 18 (FDG) INJECTION
7.0700 | Freq: Once | INTRAVENOUS | Status: AC | PRN
  Administered 2021-08-07: 7.07 via INTRAVENOUS

## 2021-08-11 ENCOUNTER — Telehealth: Payer: Self-pay | Admitting: Nurse Practitioner

## 2021-08-11 NOTE — Telephone Encounter (Signed)
Pt called to reschedule her appt for 2-3. Call back at [phone]

## 2021-08-14 ENCOUNTER — Other Ambulatory Visit: Payer: Self-pay

## 2021-08-14 ENCOUNTER — Inpatient Hospital Stay: Payer: BC Managed Care – PPO | Attending: Internal Medicine | Admitting: Nurse Practitioner

## 2021-08-14 ENCOUNTER — Inpatient Hospital Stay: Payer: BC Managed Care – PPO | Admitting: Nurse Practitioner

## 2021-08-14 VITALS — BP 129/83 | HR 77 | Temp 97.5°F | Resp 16 | Wt 133.4 lb

## 2021-08-14 DIAGNOSIS — R102 Pelvic and perineal pain: Secondary | ICD-10-CM | POA: Diagnosis not present

## 2021-08-14 DIAGNOSIS — C210 Malignant neoplasm of anus, unspecified: Secondary | ICD-10-CM | POA: Diagnosis not present

## 2021-08-14 DIAGNOSIS — M62838 Other muscle spasm: Secondary | ICD-10-CM

## 2021-08-14 DIAGNOSIS — Z08 Encounter for follow-up examination after completed treatment for malignant neoplasm: Secondary | ICD-10-CM | POA: Diagnosis not present

## 2021-08-14 DIAGNOSIS — Z85048 Personal history of other malignant neoplasm of rectum, rectosigmoid junction, and anus: Secondary | ICD-10-CM | POA: Diagnosis not present

## 2021-08-14 NOTE — Progress Notes (Signed)
[facility] OFFICE PROGRESS NOTE  Patient Care Team: Pcp, No as PCP - General   Cancer Staging  No matching staging information was found for the patient.   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  Oncotype recurrence score 18; April 2007-tamoxifen plus Lupron.;  March 2008-CIN-1-total abdominal hysterectomy and bilateral salpingo-oophorectomy plus appendectomy.;  February 2008-Lupron discontinued; continue tamoxifen.  June 2017-2019-anastrozole; October 2019 stopped anastrozole-declining bone density.   Anal cancer ([place])  05/08/2021 Initial Diagnosis   Anal cancer ([place])      HPI: Alone.  Walking independently  Yolanda Huerta 56 y.o.  female pleasant patient above history of  anal cancer stage III status post chemoradiation is here for follow-up.   Patient finished radiation OCT 11th, 2022-in [place]. Reports ongoing painful intercourse. Husband accompanies her today. Describes significant pain after insertion. Has tried vaginal dilators without improvement in symptoms. Otherwise she feels well. Denies vaginal dryness. Denies painful bowel movements.    Review of Systems  Constitutional:  Negative for chills, diaphoresis, fever, malaise/fatigue and weight loss.  HENT:  Negative for nosebleeds and sore throat.   Eyes:  Negative for double vision.  Respiratory:  Negative for cough, hemoptysis, sputum production, shortness of breath and wheezing.   Cardiovascular:  Negative for chest pain, palpitations, orthopnea and  leg swelling.  Gastrointestinal:  Negative for abdominal pain, constipation, diarrhea, heartburn, melena, nausea and vomiting.  Genitourinary:  Negative for dysuria and urgency.  Musculoskeletal:  Negative for back pain and joint pain.  Skin: Negative.  Negative for itching and rash.  Neurological:  Negative for dizziness, tingling, focal weakness, weakness and headaches.  Endo/Heme/Allergies:  Does not bruise/bleed easily.  Psychiatric/Behavioral:  Negative for depression. The patient is not nervous/anxious and does not have insomnia.      PAST MEDICAL HISTORY :  Past Medical History:  Diagnosis Date   Breast cancer [facility])    age 56    PAST SURGICAL HISTORY :   Past Surgical History:  Procedure Laterality Date   complete hysterectomy     right mastectomy      FAMILY HISTORY :   Family History  Problem Relation Age of Onset   Hypertension Mother    Cancer Father        laryngeal cancer   Cancer Maternal Aunt        GYN   Colon cancer Maternal Aunt     SOCIAL HISTORY:   Social History   Tobacco Use   Smoking status: Never    Passive exposure: Never   Smokeless tobacco: Never  Vaping Use   Vaping Use: Never used  Substance Use Topics   Alcohol use: Not Currently   Drug use: Never    ALLERGIES:  is allergic to sulfa antibiotics.  MEDICATIONS:  Current Outpatient Medications  Medication Sig Dispense Refill   cholecalciferol (VITAMIN D3) 25 MCG (1000 UNIT) tablet Take 1,000 Units by mouth daily.     venlafaxine (EFFEXOR) 75 MG tablet Take 75 mg by mouth daily.     pentoxifylline (TRENTAL) 400 MG CR tablet Take 400 mg by mouth 3 (three) times daily. (Patient not taking: Reported on 07/10/2021)  phenazopyridine (PYRIDIUM) 100 MG tablet Take 1 tablet (100 mg total) by mouth 3 (three) times daily with meals. (Patient not taking: Reported on 07/10/2021) 30 tablet 1   triamcinolone ointment (KENALOG) 0.5 % Apply 1 application topically 2 (two) times daily. (Patient  not taking: Reported on 07/10/2021) 30 g 0   Vitamin E 270 MG (600 UNIT) CAPS Take 3 capsules by mouth daily. (Patient not taking: Reported on 08/14/2021)     No current facility-administered medications for this visit.    PHYSICAL EXAMINATION: ECOG PERFORMANCE STATUS: 1 - Symptomatic but completely ambulatory  BP 129/83    Pulse 77    Temp (!) 97.5 F (36.4 C)    Resp 16    Wt 133 lb 6.4 oz (60.5 kg)    SpO2 98%   Filed Weights   08/14/21 1304  Weight: 133 lb 6.4 oz (60.5 kg)    Physical Exam Vitals and nursing note reviewed.  HENT:     Head: Normocephalic and atraumatic.     Mouth/Throat:     Pharynx: Oropharynx is clear.  Eyes:     Extraocular Movements: Extraocular movements intact.     Pupils: Pupils are equal, round, and reactive to light.  Cardiovascular:     Rate and Rhythm: Normal rate and regular rhythm.  Abdominal:     Palpations: Abdomen is soft.  Musculoskeletal:        General: Normal range of motion.     Cervical back: Normal range of motion.  Skin:    General: Skin is warm.  Neurological:     General: No focal deficit present.     Mental Status: She is alert and oriented to person, place, and time.  Psychiatric:        Behavior: Behavior normal.        Judgment: Judgment normal.   Pelvic: Exam Chaperoned by Zaria/CMA.  EGBUS: no lesions. Mildly atrophic. Speculum exam deferred d/t pain. BME: no agglutination. Approximately 5 cm into vagina a taut band of tissue was palpated and patient reports as area of pain. Able to palpate higher into the vagina and no masses or other painful areas noted. Vaginal length is preserved.    LABORATORY DATA:  I have reviewed the data as listed    Component Value Date/Time   NA 138 07/10/2021 0944   K 3.8 07/10/2021 0944   CL 101 07/10/2021 0944   CO2 30 07/10/2021 0944   GLUCOSE 93 07/10/2021 0944   BUN 18 07/10/2021 0944   CREATININE 0.52 07/10/2021 0944   CALCIUM 8.6 (L) 07/10/2021 0944   PROT 6.6 07/10/2021  0944   ALBUMIN 3.7 07/10/2021 0944   AST 26 07/10/2021 0944   ALT 21 07/10/2021 0944   ALKPHOS 61 07/10/2021 0944   BILITOT 0.6 07/10/2021 0944   GFRNONAA >60 07/10/2021 0944    No results found for: SPEP, UPEP  Lab Results  Component Value Date   WBC 2.9 (L) 07/10/2021   NEUTROABS 1.5 (L) 07/10/2021   HGB 12.3 07/10/2021   HCT 38.1 07/10/2021   MCV 100.3 (H) 07/10/2021   PLT 202 07/10/2021      Chemistry      Component Value Date/Time   NA 138 07/10/2021 0944   K 3.8 07/10/2021 0944   CL 101 07/10/2021 0944   CO2 30 07/10/2021 0944   BUN 18 07/10/2021 0944   CREATININE 0.52 07/10/2021 0944      Component Value Date/Time   CALCIUM 8.6 (L) 07/10/2021 0944  ALKPHOS 61 07/10/2021 0944   AST 26 07/10/2021 0944   ALT 21 07/10/2021 0944   BILITOT 0.6 07/10/2021 0944       RADIOGRAPHIC STUDIES: I have personally reviewed the radiological images as listed and agreed with the findings in the report. No results found.   ASSESSMENT & PLAN:  No problem-specific Assessment & Plan notes found for this encounter. #Anal cancer stage III [cT1cN1; [place]]-s/p concurrent chemoradiation with continuous 5-FU infusion plus mitomycin day 1 day 29.  Finished radiation on Oct, 11th, 2022.   # I independently reviewed PET scan from 08/07/21 and agree with findings- Previously, hypermetabolic anal activity has resolved in additional to inguinal lymph node hypermetabolic activity. No other evidence of local or distant recurrence. Reviewed NCCN surveillance guidelines including history of physical exam inclujding digital rectal exam in 8-12 weeks then DRE every 3-6 months for 5 years with inguinal lymph nodes, anoscopy x 3 years, and imaging.    # Radiation dermatitis- resolved. STOP pyridium/trentral.    # Sexual dysfunction/painful coitus/Levator spasm/Pelvic pain syndrome- suspect she is experiencing levator spasms and trigger point based on exam and clinical history. This is  also why she is experiencing a constellation of other pelvic symptoms from urinary to rectal. We reviewed anatomy of pelvic floor and how this is related to treatment that she underwent for cancer. I provided her with a medium sized vaginal dilator. Recommended she use ample lubricant and can eventually perform vaginal dilator exercises once or twice a day several times a week to keep vaginal open. However, I reviewed that she would benefit most from pelvic floor physical therapy which may include external and internal PT. Reviewed that it is reassuring that her vagina has not agglutinated and she has preserved vaginal length.   # Hypoestrogenism/Vaginal atrophy- mild. She has a history of ER/PR positive breast cancer so I would not recommend vaginal estrogen. We reviewed that there is less systemic absorption with these formulations, however, risk may outweigh benefit. We can re-evaluate in the future if she does not get significant relief from other modalities.   # HSIL- s/p RR-TAH-BSO (breast cancer - see below). Last pap in 2015 was negative. NILM and possibly HPV positive 01/22/21 by Dr. Asencion Gowda Lim/gyn onc. Given history of SCC of anus she needs a vaginal pap. I can see her back in the future to perform this or can refer her to gynecology locally. Discuss at next visit. Speculum exam was not performed today d/t pain/spasm.    # Throid nodule-6 mm incidental [PET aug 2022]-await repeat PET imaging as above/consider biopsy.  # Hx of right breast cancer- s/p mastectomy February 2007. Oncotype recurrence score 18; April 2007-tamoxifen plus Lupron.; March 2008-CIN-1-total abdominal hysterectomy and bilateral salpingo-oophorectomy plus appendectomy.; February 2008-Lupron discontinued; continue tamoxifen. June 2017-2019-anastrozole; October 2019 stopped anastrozole d/t declining bone density.  # Bone density- discuss bone density at next visit or refer to pcp.    #Port flush- every 8-12 weeks.   #  DISPOSITION: Referral to physical therapy (provided [facility] referral and [facility] (self-file insurance). Discussed with Gwenette Greet as well. Follow up with Dr. Rogue Bussing and port flush in 3 months  Follow up with me as needed.   No orders of the defined types were placed in this encounter.  All questions were answered. The patient knows to call the clinic with any problems, questions or concerns.    Verlon Au, NP 08/14/2021   CC: Dr. Rogue Bussing

## 2021-08-14 NOTE — Patient Instructions (Signed)
I've sent a referral to [facility] for pelvic floor PT. You can also contact [facility] who also does pelvic PT. Her office number is: [phone] or [email or web address]

## 2021-08-17 ENCOUNTER — Other Ambulatory Visit: Payer: BC Managed Care – PPO

## 2021-08-20 NOTE — Progress Notes (Signed)
Discussed with Dr. Bary Castilla. Re: s/p CRT for anal cancer. Needs exam with Anoscopy with possible biopsy. Follow up with as previously recommended. GB

## 2021-09-25 ENCOUNTER — Ambulatory Visit: Payer: BC Managed Care – PPO | Admitting: Physical Therapy

## 2021-10-02 ENCOUNTER — Ambulatory Visit: Payer: BC Managed Care – PPO | Attending: Nurse Practitioner | Admitting: Physical Therapy

## 2021-10-09 ENCOUNTER — Encounter: Payer: BC Managed Care – PPO | Admitting: Physical Therapy

## 2021-10-16 ENCOUNTER — Encounter: Payer: Self-pay | Admitting: Physical Therapy

## 2021-10-16 ENCOUNTER — Ambulatory Visit: Payer: BC Managed Care – PPO | Attending: Nurse Practitioner | Admitting: Physical Therapy

## 2021-10-16 DIAGNOSIS — R102 Pelvic and perineal pain: Secondary | ICD-10-CM | POA: Diagnosis present

## 2021-10-16 DIAGNOSIS — R278 Other lack of coordination: Secondary | ICD-10-CM | POA: Insufficient documentation

## 2021-10-16 DIAGNOSIS — M62838 Other muscle spasm: Secondary | ICD-10-CM | POA: Diagnosis present

## 2021-10-16 NOTE — Therapy (Signed)
?OUTPATIENT PHYSICAL THERAPY FEMALE PELVIC EVALUATION ? ? ?Patient Name: Yolanda Huerta ?MRN: [id number] ?DOB:[birth year], 56 y.o., female ?Today's Date: 10/16/2021 ? ? PT End of Session - 10/16/21 0856   ? ? Visit Number 1   ? Number of Visits 12   ? Date for PT Re-Evaluation 01/08/22   ? Authorization Type IE 10/16/2021   ? PT Start Time [phone]   ? PT Stop Time 0930   ? PT Time Calculation (min) 35 min   ? Activity Tolerance Patient tolerated treatment well   ? Behavior During Therapy [facility] for tasks assessed/performed   ? ?  ?  ? ?  ? ? ?Past Medical History:  ?Diagnosis Date  ? Breast cancer ([place])   ? age 75  ? ?Past Surgical History:  ?Procedure Laterality Date  ? complete hysterectomy    ? right mastectomy    ? ?Patient Active Problem List  ? Diagnosis Date Noted  ? Anal cancer ([place]) 05/08/2021  ? 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 Left eye pain 08/05/2020  ? Viral conjunctivitis of left eye 08/05/2020  ? Onychomycosis of great toe 09/23/2017  ? Psoriasis 03/12/2016  ? History of breast augmentation 03/22/2014  ? History of total mastectomy of right breast 03/22/2014  ? Dermatitis 09/22/2012  ? Atypical squamous cells cannot exclude high grade squamous intraepithelial lesion on cytologic smear of cervix (ASC-H) 06/18/2011  ? Tendonitis 04/17/2009  ? Depression 01/17/2009  ? Breast cancer ([place]) 04/26/2006  ? Molluscum contagiosum 11/02/2005  ? Neuritis 11/02/2005  ? Ovarian cyst 11/02/2005  ? ? ?PCP: Pcp, No ? ?REFERRING PROVIDER: Verlon Au, NP ? ?REFERRING DIAG: G26.948 (ICD-10-CM) - Levator spasm ?R10.2 (ICD-10-CM) - Pelvic pain in female ? ? ? ?THERAPY DIAG:  ?Other muscle spasm ? ?Other lack of coordination ? ?Pelvic pain ? ?ONSET DATE: 2022 ? ?SUBJECTIVE:                                                                                                                                                                                          ? ?Chief  complaint: Patient notes that she has long standing history uncomfortable sex and this has been made worse by radiation therapy for anal cancer. Patient notes she has intense pain now. Patient reports that she has been using dilators with ill effect. Patient had gynecological exam and was told that she has length but has ablations (?).  ? ? ?Pain: ?Are you having pain? Yes ?Worst pain: 10/10, penetrative sex, 7/10 gynecological exam (vaginal) ?Least pain: 0/10 ?NPRS scale: 2/10 (bowel/abdominal) ?Pain location: Internal  vaginally ? ?Pain type: burning, tight, and tearing ?Pain description: intermittent  ?Duration: 5 minutes ?Relieving factors: none noted ? ?Precautions: Other: hx of anal cancer ? ?Falls: Has patient fallen in last 6 months? No ? ?Occupation/Current Activities: works as a Arboriculturist ? ?PLOF: Independent ? ?Patient stated goals: "feel like myself" ? ?Pertinent History: ?Surgical history: Positive for see above. ? ?Obstetrical History: ?G1P1 ?Deliveries: SVD ?Tearing/Episiotomy: episiotomy, no issues with uncomfortable sex after episiotomy ? ?Gynecological History: ?Hysterectomy: Yes Vaginal/Abdominal ?Endometriosis: Negative ?Pelvic Organ Prolapse: Negative ?Last Menstrual Period:  ?Pain with exam: Yes ? ?Urinary History: ?Frequency of urination: every 1 hours ?Incontinence: Urge to void, Walking to the bathroom, Coughing, Sneezing, and Laughing ? Onset: prior to radiation Amount: Min/Mod. ?Protective undergarments: Yes   ?Fluid Intake: 1x16.9 oz H20, 1 cup coffee caffeinated, 2x 12 oz diet sodas ?Nocturia: 0x/night ?Complete emptying: Yes:   ?Pain with urination: Negative ?Stream: Strong ?Urgency: Yes:   ?Difficulty initiating urination: Negative ?Intermittent stream: Positive ?Frequent UTI: Negative. ? ?Gastrointestinal History: ?Type of bowel movement:Type (Bristol Stool Scale) 1-7, very irregular ?Frequency of BMs: irregular, sometimes days between ?Complete bowel movement: No ?Pain with  defecation: Negative ?Straining with defecation: Negative ?Hemorrhoids: Negative ; internal/external; active/latent ?Incontinence: Positive, loose stools 90%, solid 10% ?Onset: radiation Triggers: urge and stress Amount: Min/Mod. ?Pads: Yes:   ? ?Sexual History: ?Pain with penetration: Pain Interrupts Intercourse ?Pain with external stimulation: No ? ? ?OBJECTIVE:  ? ?COGNITION: ?Overall cognitive status: Within functional limits for tasks assessed   ?  ?POSTURE/OBSERVATIONS:  ?Lumbar lordosis: appearing WNL ?Thoracic kyphosis: appearing WNL ?Iliac crest height: not formally assessed ?Lumbar lateral shift: not formally assessed  ?Pelvic obliquity: not formally assessed  ?Leg length discrepancy: not formally assessed  ? ?GAIT: ?No gross aberrations noted.  ? ?RANGE OF MOTION: deferred 2/2 to time constraints ?  LEFT RIGHT  ?Lumbar forward flexion (65):      ?Lumbar extension (30):     ?Lumbar lateral flexion (25):     ?Thoracic and Lumbar rotation (30 degrees):       ?Hip Flexion (0-125):      ?Hip IR (0-45):     ?Hip ER (0-45):     ?Hip Abduction (0-40):     ?Hip extension (0-15):     ? ?SENSATION:deferred 2/2 to time constraints ?Grossly intact to light touch bilateral LEs as determined by testing dermatomes L2-S2 ?Proprioception and hot/cold testing deferred on this date ? ?STRENGTH: MMT deferred 2/2 to time constraints ? RLE LLE  ?Hip Flexion    ?Hip Extension    ?Hip Abduction     ?Hip Adduction     ?Hip ER     ?Hip IR     ?Knee Extension    ?Knee Flexion    ?Dorsiflexion     ?Plantarflexion (seated)    ? ?ABDOMINAL: deferred 2/2 to time constraints ?Palpation: ?Diastasis: ?Scar mobility: ?Rib flare: ? ?SPECIAL TESTS:deferred 2/2 to time constraints ? ?PHYSICAL PERFORMANCE MEASURES: ?STS: WNL ?Deep Squat: ?RLE STS: ?LLE STS:  ?6MWT: ?5TSTS:   ? ?EXTERNAL PELVIC EXAM: deferred 2/2 to time constraints ?Breath coordination: ?Voluntary Contraction: present/absent ?Relaxation: full/delayed/non-relaxing ?Perineal  movement with sustained IAP increase ("bear down"): descent/no change/elevation/excessive descent ?Perineal movement with rapid IAP increase ("cough"): elevation/no change/descent ? ?INTERNAL VAGINAL EXAM: deferred 2/2 to time constraints ?Introitus Appears:  ?Skin integrity:  ?Scar mobility: ?Strength (PERF):  ?Symmetry: ?Palpation: ?Prolapse: ? ?INTERNAL RECTAL EXAM: not indicated ?Strength (PERF): ?Symmetry: ?Palpation: ?Prolapse: ? ? ?PATIENT EDUCATION:  ?Patient educated on  prognosis, POC, and provided with HEP including: not initiated. Patient articulated understanding and returned demonstration. Patient will benefit from further education in order to maximize compliance and understanding for long-term therapeutic gains. ? ?PATIENT SURVEYS:  ?FOTO Bowel Leakage 50, Urinary Problem 52, PFDI Pain 17 ? ?ASSESSMENT: ? ?Clinical impression: ?Patient is a 56 year old presenting to clinic with chief complaints of bowel and bladder control issues as well as sexual dysfunction. Today's evaluation is suggestive of deficits in PFM strength, PFM coordination, IAP management and pain as evidenced by 10/10 pain with penetrative sex, 7/10 pain with pelvic exam, accidental bowel leakage with urge and stress components, UI with urge and stress components. Patient's responses on FOTO outcome measures (Bowel Leakage 50, Urinary Problem 52, PFDI Pain 17) indicate moderate functional limitations/disability/distress. Patient's progress may be limited due to limited ability to participate in PFPT at regular/consistent intervals. Patient will benefit from continued skilled therapeutic intervention to address deficits in PFM strength, PFM coordination, IAP management and pain in order to increase function and improve overall QOL. ? ? ?Objective impairments: decreased activity tolerance, decreased coordination, decreased endurance, decreased strength, increased fascial restrictions, impaired flexibility, improper body mechanics, and  pain.  ? ?Activity limitations: cleaning, community activity, occupation, laundry, yard work, and interpersonal relationships .  ? ?Personal factors: Behavior pattern, Past/current experiences, Profession, Octavia Bruckner

## 2021-10-23 ENCOUNTER — Encounter: Payer: BC Managed Care – PPO | Admitting: Physical Therapy

## 2021-10-23 ENCOUNTER — Ambulatory Visit: Payer: BC Managed Care – PPO | Admitting: Physical Therapy

## 2021-10-30 ENCOUNTER — Encounter: Payer: BC Managed Care – PPO | Admitting: Physical Therapy

## 2021-11-06 ENCOUNTER — Encounter: Payer: BC Managed Care – PPO | Admitting: Physical Therapy

## 2021-11-13 ENCOUNTER — Encounter: Payer: BC Managed Care – PPO | Admitting: Physical Therapy

## 2021-11-27 ENCOUNTER — Ambulatory Visit: Payer: BC Managed Care – PPO | Attending: Nurse Practitioner | Admitting: Physical Therapy

## 2021-11-27 ENCOUNTER — Encounter: Payer: Self-pay | Admitting: Physical Therapy

## 2021-11-27 DIAGNOSIS — R102 Pelvic and perineal pain: Secondary | ICD-10-CM | POA: Insufficient documentation

## 2021-11-27 DIAGNOSIS — R278 Other lack of coordination: Secondary | ICD-10-CM | POA: Insufficient documentation

## 2021-11-27 DIAGNOSIS — M62838 Other muscle spasm: Secondary | ICD-10-CM | POA: Insufficient documentation

## 2021-11-27 NOTE — Therapy (Addendum)
OUTPATIENT PHYSICAL THERAPY FEMALE PELVIC TREATMENT   Patient Name: Yolanda Huerta MRN: [id number] DOB:June 25, 1966, 56 y.o., female Today's Date: 11/27/2021   PT End of Session - 11/27/21 0847     Visit Number 2    Number of Visits 12    Date for PT Re-Evaluation 01/08/22    Authorization Type IE 10/16/2021    PT Start Time 0847    PT Stop Time 0925    PT Time Calculation (min) 38 min    Activity Tolerance Patient tolerated treatment well    Behavior During Therapy [facility] for tasks assessed/performed             Past Medical History:  Diagnosis Date   Breast cancer [facility])    age 7   Past Surgical History:  Procedure Laterality Date   complete hysterectomy     right mastectomy     Patient Active Problem List   Diagnosis Date Noted   Anal cancer ([place]) 05/08/2021   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   SCCA (squamous cell carcinoma) of skin 01/05/2021   Left eye pain 08/05/2020   Viral conjunctivitis of left eye 08/05/2020   Onychomycosis of great toe 09/23/2017   Psoriasis 03/12/2016   History of breast augmentation 03/22/2014   History of total mastectomy of right breast 03/22/2014   Dermatitis 09/22/2012   Atypical squamous cells cannot exclude high grade squamous intraepithelial lesion on cytologic smear of cervix (ASC-H) 06/18/2011   Tendonitis 04/17/2009   Depression 01/17/2009   Breast cancer ([place]) 04/26/2006   Molluscum contagiosum 11/02/2005   Neuritis 11/02/2005   Ovarian cyst 11/02/2005    PCP: Pcp, No  REFERRING PROVIDER: Verlon Au, NP  REFERRING DIAG: [phone] (ICD-10-CM) - Levator spasm R10.2 (ICD-10-CM) - Pelvic pain in female    THERAPY DIAG:  Other muscle spasm  Other lack of coordination  Pelvic pain  ONSET DATE: 2022  SUBJECTIVE:                                                                                                                                                                                           Chief  complaint: Patient denies any changes since initial evaluation. Patient notes frustration with dilator training but was previously doing 10 min/day 1x day and speculates that she was doing this for 2 months.   Pain: Are you having pain? No NPRS scale: 0/10   Pain type: burning, tight, and tearing Pain description: intermittent  Duration: 5 minutes Relieving factors: none noted  Urinary History: Frequency of urination: every 1 hours Incontinence: Urge to void, Walking to the bathroom, Coughing, Sneezing, and Laughing  Onset: prior to radiation Amount: Min/Mod. Protective undergarments: Yes   Fluid Intake: 1x16.9 oz H20, 1 cup coffee caffeinated, 2x 12 oz diet sodas Nocturia: 0x/night Complete emptying: Yes:   Pain with urination: Negative Stream: Strong Urgency: Yes:   Difficulty initiating urination: Negative Intermittent stream: Positive Frequent UTI: Negative.  Gastrointestinal History: Type of bowel movement:Type (Bristol Stool Scale) 1-7, very irregular Frequency of BMs: irregular, sometimes days between Complete bowel movement: No Pain with defecation: Negative Straining with defecation: Negative Hemorrhoids: Negative ; internal/external; active/latent Incontinence: Positive, loose stools 90%, solid 10% Onset: radiation Triggers: urge and stress Amount: Min/Mod. Pads: Yes:    Sexual History: Pain with penetration: Pain Interrupts Intercourse Pain with external stimulation: No   OBJECTIVE:  Pre-treatment assessment: RANGE OF MOTION:    LEFT RIGHT  Lumbar forward flexion (65):  WNL    Lumbar extension (30): WNL    Lumbar lateral flexion (25):  WNL WNL  Thoracic and Lumbar rotation (30 degrees):    WNL WNL  Hip Flexion (0-125):   WNL WNL  Hip IR (0-45):  WNL WNL  Hip ER (0-45):  WNL WNL  Hip Abduction (0-40):  WNL WNL   SENSATION: Grossly intact to light touch bilateral LEs as determined by testing dermatomes L2-S2 Proprioception and hot/cold testing  deferred on this date  STRENGTH: MMT   RLE LLE  Hip Flexion 5 5  Hip Extension 5 5  Hip Abduction  5 5  Hip Adduction  5 5  Hip ER  5 5  Hip IR  5 5  Knee Extension 5 5  Knee Flexion 5 5   ABDOMINAL:  Palpation: mild tenderness with deep pressure over suprapubic region, but patient denies "pain" Diastasis: none present on testing Scar mobility: not assessed Rib flare: negative  EXTERNAL PELVIC EXAM: Patient educated on the purpose of the pelvic exam and articulated understanding; patient consented to the exam verbally.  Breath coordination: present minimally,  Voluntary Contraction: present, roughly 1-2/5 MMT (weak squeeze no lift, likely 2/2 to radiation therapy related shortening of tissues) Relaxation: full Perineal movement with sustained IAP increase ("bear down"): descent (minimal) Perineal movement with rapid IAP increase ("cough"): descent  TREATMENT  Manual Therapy:   Neuromuscular Re-education: Supine knee to chest with PFM lengthening, BLE, for improved PFM spasm release Supine double knee to chest with PFM lengthening for improved PFM spasm release Supine butterfly with PFM lengthening, BLE, for improved PFM tissue length Patient education on dilator training and progressions for improved success with pre-existing home program.  Therapeutic Exercise:   Treatments unbilled:  Post-treatment assessment:  Patient educated throughout session on appropriate technique and form using multi-modal cueing, HEP, and activity modification. Patient articulated understanding and returned demonstration.  Patient Response to interventions: Comfortable to continue/progress with home dilator training for 1 month prior to returning to clinic.    PATIENT SURVEYS:  IE 10/16/2021: FOTO Bowel Leakage 50, Urinary Problem 52, PFDI Pain 17  ASSESSMENT:  Clinical impression: Patient presents to clinic with excellent motivation to participate in therapy. Patient demonstrates  deficits in PFM strength, PFM coordination, IAP management and pain. Physical examination today reveals adequate spine and hip ROM as well as strength and no concerning separation of rectus abdominis. However, external examination of the PFM indicates minimal breath coordination for IAP management, strength deficits (1-2/5 MMT), paradoxical perineal descent with sudden increase in IAP (cough). Patient able to perform basic supine PFM stretches with coordinated diaphragmatic breath without complaint during today's session and responded positively to  educational interventions on progressing pre-existing dilator training as prescribed by surgeon. Follow-up with this patient was delayed by 6 weeks due to scheduling conflicts and limited availability in the patient's schedule for participation, but patient remains motivated to participate. These scheduling needs may result in delayed rehabilitative progress. Patient will benefit from continued skilled therapeutic intervention to address remaining deficits in PFM strength, PFM coordination, IAP management and pain in order to increase function and improve overall QOL.   Objective impairments: decreased activity tolerance, decreased coordination, decreased endurance, decreased strength, increased fascial restrictions, impaired flexibility, improper body mechanics, and pain.   Activity limitations: cleaning, community activity, occupation, laundry, yard work, and interpersonal relationships .   Personal factors: Behavior pattern, Past/current experiences, Profession, Time since onset of injury/illness/exacerbation, and 3+ comorbidities: depression, anal cancer, breast cancer hx, ovarian cyst  are also affecting patient's functional outcome.   Rehab Potential: Fair    Clinical decision making: Evolving/moderate complexity  Evaluation complexity: Moderate   GOALS: Goals reviewed with patient? Yes  SHORT TERM GOALS: Target date: 11/27/2021  Patient will  demonstrate independence with HEP in order to maximize therapeutic gains and improve carryover from physical therapy sessions to ADLs in the home and community. Baseline: not initiated; 5/19: provided with PFM stretches (initial follow-up appointment delayed 2/2 difficulty coordinating appointment with patient's work schedule) Goal status: IN PROGRESS   LONG TERM GOALS: Target date: 01/08/2022  Patient will demonstrate improved function as evidenced by a score of 62 on FOTO Urinary Probem measure for full participation in activities at home and in the community. Baseline: 52 Goal status: INITIAL  2.  Patient will demonstrate improved function as evidenced by a score of 63 on FOTO Bowel Leakage measure for full participation in activities at home and in the community.  Baseline: 50 Goal status: INITIAL  3.  Patient will demonstrate improved function as evidenced by a score of <8 on PFDI Pain measure for full participation in activities at home and in the community. Baseline: 17 Goal status: INITIAL     PLAN: Rehab frequency: 1x/week  Rehab duration: 12 weeks  Planned interventions: Therapeutic exercises, Therapeutic activity, Neuromuscular re-education, Balance training, Gait training, Patient/Family education, Joint mobilization, Orthotic/Fit training, Electrical stimulation, Spinal manipulation, Spinal mobilization, Cryotherapy, Moist heat, scar mobilization, Taping, Biofeedback, and Manual therapy     Myles Gip PT, DPT [phone]  11/27/2021, 8:48 AM

## 2021-11-30 ENCOUNTER — Other Ambulatory Visit: Payer: Self-pay | Admitting: Internal Medicine

## 2021-11-30 DIAGNOSIS — E042 Nontoxic multinodular goiter: Secondary | ICD-10-CM

## 2021-12-11 ENCOUNTER — Ambulatory Visit: Payer: BC Managed Care – PPO

## 2021-12-25 ENCOUNTER — Ambulatory Visit: Payer: BC Managed Care – PPO

## 2022-01-15 ENCOUNTER — Ambulatory Visit: Payer: BC Managed Care – PPO | Attending: Nurse Practitioner | Admitting: Physical Therapy

## 2022-01-15 ENCOUNTER — Telehealth: Payer: Self-pay | Admitting: Physical Therapy

## 2022-01-15 NOTE — Telephone Encounter (Signed)
LVM for patient to call and confirm next appointment and to let them know of missed appointment today. I left our phone number and asked her to give [place] a call.

## 2022-01-22 ENCOUNTER — Ambulatory Visit: Payer: BC Managed Care – PPO | Admitting: Physical Therapy

## 2022-01-29 ENCOUNTER — Encounter: Payer: BC Managed Care – PPO | Admitting: Physical Therapy

## 2022-02-05 ENCOUNTER — Encounter: Payer: BC Managed Care – PPO | Admitting: Physical Therapy

## 2022-02-10 ENCOUNTER — Ambulatory Visit: Payer: BC Managed Care – PPO | Admitting: Internal Medicine

## 2022-02-12 ENCOUNTER — Ambulatory Visit: Payer: BC Managed Care – PPO | Admitting: Internal Medicine

## 2022-02-12 ENCOUNTER — Ambulatory Visit: Payer: BC Managed Care – PPO

## 2022-02-12 ENCOUNTER — Encounter: Payer: BC Managed Care – PPO | Admitting: Physical Therapy

## 2022-02-19 ENCOUNTER — Encounter: Payer: BC Managed Care – PPO | Admitting: Physical Therapy

## 2022-02-26 ENCOUNTER — Encounter: Payer: BC Managed Care – PPO | Admitting: Physical Therapy

## 2022-03-05 ENCOUNTER — Encounter: Payer: BC Managed Care – PPO | Admitting: Physical Therapy

## 2022-03-09 ENCOUNTER — Encounter: Payer: Self-pay | Admitting: *Deleted

## 2022-03-09 ENCOUNTER — Telehealth: Payer: Self-pay | Admitting: *Deleted

## 2022-03-09 NOTE — Telephone Encounter (Signed)
Patient called reporting that she is seeing blood after she has a bowel movement and is asking if her appointment needs to be moved up. Please advise  Next appointment 03/26/22

## 2022-03-09 NOTE — Telephone Encounter (Signed)
I again called patient for more information and again I got voice mail. I called the work number listed and was told that she moved to [facility]

## 2022-03-10 NOTE — Telephone Encounter (Signed)
I have called patient phone again this morning and still am getting her voice mail

## 2022-03-24 ENCOUNTER — Telehealth: Payer: Self-pay | Admitting: Internal Medicine

## 2022-03-24 NOTE — Telephone Encounter (Signed)
Called patient to reschedule appointment and verify time and date.

## 2022-03-26 ENCOUNTER — Ambulatory Visit: Payer: BC Managed Care – PPO | Admitting: Internal Medicine

## 2022-03-26 ENCOUNTER — Inpatient Hospital Stay: Payer: BC Managed Care – PPO

## 2022-03-26 ENCOUNTER — Other Ambulatory Visit: Payer: Self-pay

## 2022-03-26 ENCOUNTER — Ambulatory Visit
Admission: RE | Admit: 2022-03-26 | Discharge: 2022-03-26 | Disposition: A | Discharge status: Home / Self Care | Payer: BC Managed Care – PPO | Source: Ambulatory Visit | Attending: Internal Medicine | Admitting: Internal Medicine

## 2022-03-26 ENCOUNTER — Encounter: Payer: Self-pay | Admitting: Medical Oncology

## 2022-03-26 ENCOUNTER — Inpatient Hospital Stay: Payer: BC Managed Care – PPO | Attending: Internal Medicine | Admitting: Medical Oncology

## 2022-03-26 VITALS — BP 125/77 | HR 71 | Temp 97.5°F | Ht 66.0 in | Wt 140.0 lb

## 2022-03-26 DIAGNOSIS — Z853 Personal history of malignant neoplasm of breast: Secondary | ICD-10-CM

## 2022-03-26 DIAGNOSIS — Z08 Encounter for follow-up examination after completed treatment for malignant neoplasm: Secondary | ICD-10-CM

## 2022-03-26 DIAGNOSIS — Z85048 Personal history of other malignant neoplasm of rectum, rectosigmoid junction, and anus: Secondary | ICD-10-CM

## 2022-03-26 DIAGNOSIS — C210 Malignant neoplasm of anus, unspecified: Secondary | ICD-10-CM | POA: Insufficient documentation

## 2022-03-26 DIAGNOSIS — Z95828 Presence of other vascular implants and grafts: Secondary | ICD-10-CM

## 2022-03-26 DIAGNOSIS — E042 Nontoxic multinodular goiter: Secondary | ICD-10-CM | POA: Diagnosis not present

## 2022-03-26 LAB — COMPREHENSIVE METABOLIC PANEL
ALT: 26 U/L (ref 0–44)
AST: 27 U/L (ref 15–41)
Albumin: 4.1 g/dL (ref 3.5–5.0)
Alkaline Phosphatase: 63 U/L (ref 38–126)
Anion gap: 6 (ref 5–15)
BUN: 20 mg/dL (ref 6–20)
CO2: 30 mmol/L (ref 22–32)
Calcium: 9.1 mg/dL (ref 8.9–10.3)
Chloride: 104 mmol/L (ref 98–111)
Creatinine, Ser: 0.64 mg/dL (ref 0.44–1.00)
GFR, Estimated: >60 mL/min (ref >60)
Glucose, Bld: 98 mg/dL (ref 70–99)
Potassium: 3.6 mmol/L (ref 3.5–5.1)
Sodium: 140 mmol/L (ref 135–145)
Total Bilirubin: 0.6 mg/dL (ref 0.3–1.2)
Total Protein: 7.1 g/dL (ref 6.5–8.1)

## 2022-03-26 LAB — CBC
HCT: 38.7 % (ref 36.0–46.0)
Hemoglobin: 13 g/dL (ref 12.0–15.0)
MCH: 31.9 pg (ref 26.0–34.0)
MCHC: 33.6 g/dL (ref 30.0–36.0)
MCV: 95.1 fL (ref 80.0–100.0)
Platelets: 210 10*3/uL (ref 150–400)
RBC: 4.07 MIL/uL (ref 3.87–5.11)
RDW: 12.9 % (ref 11.5–15.5)
WBC: 3.9 10*3/uL — ABNORMAL LOW (ref 4.0–10.5)
nRBC: 0 % (ref 0.0–0.2)

## 2022-03-26 NOTE — Progress Notes (Signed)
[facility] OFF[facility] PROGRESS NOTE  Patient Care Team: Pcp, No as PCP - General   Cancer Staging  No matching staging information was found for the patient.   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  Oncotype recurrence score 18; April 2007-tamoxifen plus Lupron.;  March 2008-CIN-1-total abdominal hysterectomy and bilateral salpingo-oophorectomy plus appendectomy.;  February 2008-Lupron discontinued; continue tamoxifen.  June 2017-2019-anastrozole; October 2019 stopped anastrozole-declining bone density.   Anal cancer ([place])  05/08/2021 Initial Diagnosis   Anal cancer ([place])      HPI: Alone.  Walking independently  Yolanda Huerta 56 y.o.  female pleasant patient above history of  anal cancer stage III status post chemoradiation is here for follow-up. Also has history of breast cancer 2007 as shown above. Overdue for follow up.   In terms of her anal cancer, she finished radiation OCT 11th, 2022-in [place]. She established with our office on 05/08/2021. Since this time she has had slight loss to follow up. Her last PET scan was on 08/07/21. She reports that overall her symptoms of perineal pain, painful coitus have improved since seeing PT and our NP Beckey Rutter. She continues to have occasional loose stools. She does report 3 episodes of spotting when she uses the restroom. Last occurrence was 3 weeks ago. No other symptoms such as night sweats, unintentional weight loss, blood stools, constipation.   Review of Systems   Constitutional:  Negative for chills, diaphoresis, fever, malaise/fatigue and weight loss.  HENT:  Negative for nosebleeds and sore throat.   Eyes:  Negative for double vision.  Respiratory:  Negative for cough, hemoptysis, sputum production, shortness of breath and wheezing.   Cardiovascular:  Negative for chest pain, palpitations, orthopnea and leg swelling.  Gastrointestinal:  Negative for abdominal pain, constipation, diarrhea, heartburn, melena, nausea and vomiting.  Genitourinary:  Negative for dysuria.  Musculoskeletal:  Negative for back pain and joint pain.  Skin: Negative.  Negative for itching and rash.  Neurological:  Negative for dizziness, tingling, focal weakness, weakness and headaches.  Endo/Heme/Allergies:  Does not bruise/bleed easily.  Psychiatric/Behavioral:  Negative for depression. The patient is not nervous/anxious and does not have insomnia.       PAST MEDICAL HISTORY :  Past Medical History:  Diagnosis Date   Anal squamous cell carcinoma ([place])    Breast cancer ([place])    age 56    PAST SURGICAL HISTORY :   Past Surgical History:  Procedure Laterality Date   complete hysterectomy     right mastectomy      FAMILY HISTORY :   Family History  Problem Relation Age of Onset   Hypertension Mother    Cancer Father        laryngeal cancer   Cancer Maternal Aunt        GYN   Colon cancer Maternal Aunt     SOCIAL HISTORY:   Social History   Tobacco Use   Smoking status: Never    Passive exposure: Never   Smokeless tobacco: Never  Vaping Use   Vaping Use: Never used  Substance Use Topics   Alcohol use: Not Currently   Drug use:  Never    ALLERGIES:  is allergic to sulfa antibiotics.  MEDICATIONS:  Current Outpatient Medications  Medication Sig Dispense Refill   cholecalciferol (VITAMIN D3) 25 MCG (1000 UNIT) tablet Take 1,000 Units by mouth daily.     venlafaxine (EFFEXOR) 75 MG tablet Take 75 mg by mouth daily.     pentoxifylline (TRENTAL)  400 MG CR tablet Take 400 mg by mouth 3 (three) times daily. (Patient not taking: Reported on 07/10/2021)     phenazopyridine (PYRIDIUM) 100 MG tablet Take 1 tablet (100 mg total) by mouth 3 (three) times daily with meals. (Patient not taking: Reported on 07/10/2021) 30 tablet 1   triamcinolone ointment (KENALOG) 0.5 % Apply 1 application topically 2 (two) times daily. (Patient not taking: Reported on 07/10/2021) 30 g 0   Vitamin E 270 MG (600 UNIT) CAPS Take 3 capsules by mouth daily. (Patient not taking: Reported on 08/14/2021)     No current facility-administered medications for this visit.    PHYSICAL EXAMINATION: ECOG PERFORMANCE STATUS: 1 - Symptomatic but completely ambulatory  BP 125/77 (BP Location: Left Arm, Patient Position: Sitting)   Pulse 71   Temp (!) 97.5 F (36.4 C) (Tympanic)   Ht '5\' 6"'  (1.676 m)   Wt 140 lb (63.5 kg)   BMI 22.60 kg/m   Filed Weights   03/26/22 1437  Weight: 140 lb (63.5 kg)    Physical Exam Vitals and nursing note reviewed.  HENT:     Head: Normocephalic and atraumatic.     Mouth/Throat:     Pharynx: Oropharynx is clear.  Eyes:     Extraocular Movements: Extraocular movements intact.     Pupils: Pupils are equal, round, and reactive to light.  Cardiovascular:     Rate and Rhythm: Normal rate and regular rhythm.  Abdominal:     Palpations: Abdomen is soft.  Musculoskeletal:        General: Normal range of motion.     Cervical back: Normal range of motion.  Skin:    General: Skin is warm.  Neurological:     General: No focal deficit present.     Mental Status: She is alert and oriented to person, place, and time.  Psychiatric:        Behavior: Behavior normal.        Judgment: Judgment normal.   Declines rectal exam   LABORATORY DATA:  I have reviewed the data as listed    Component Value Date/Time   NA 140 03/26/2022 1505   K 3.6 03/26/2022 1505   CL 104 03/26/2022 1505   CO2 30 03/26/2022 1505   GLUCOSE 98 03/26/2022 1505    BUN 20 03/26/2022 1505   CREATININE 0.64 03/26/2022 1505   CALCIUM 9.1 03/26/2022 1505   PROT 7.1 03/26/2022 1505   ALBUMIN 4.1 03/26/2022 1505   AST 27 03/26/2022 1505   ALT 26 03/26/2022 1505   ALKPHOS 63 03/26/2022 1505   BILITOT 0.6 03/26/2022 1505   GFRNONAA >60 03/26/2022 1505    No results found for: "SPEP", "UPEP"  Lab Results  Component Value Date   WBC 3.9 (L) 03/26/2022   NEUTROABS 1.5 (L) 07/10/2021   HGB 13.0 03/26/2022   HCT 38.7 03/26/2022   MCV 95.1 03/26/2022   PLT 210 03/26/2022      Chemistry      Component Value Date/Time   NA 140 03/26/2022 1505   K 3.6 03/26/2022 1505   CL 104 03/26/2022 1505   CO2 30 03/26/2022 1505  BUN 20 03/26/2022 1505   CREATININE 0.64 03/26/2022 1505      Component Value Date/Time   CALCIUM 9.1 03/26/2022 1505   ALKPHOS 63 03/26/2022 1505   AST 27 03/26/2022 1505   ALT 26 03/26/2022 1505   BILITOT 0.6 03/26/2022 1505       RADIOGRAPHIC STUDIES: I have personally reviewed the radiological images as listed and agreed with the findings in the report. No results found.   ASSESSMENT & PLAN:  No problem-specific Assessment & Plan notes found for this encounter.  Encounter Diagnoses  Name Primary?   History of breast cancer    Encounter for follow-up surveillance of anal cancer Yes   Port-A-Cath in place     Anal cancer ([place]) #Anal cancer stage III [cT1cN1; [place]]-s/p concurrent chemoradiation with continuous 5-FU infusion plus mitomycin day 1 day 29.  Finished radiation on Oct, 11th, 2022.   Discussed importance of close medical follow up. We will repeat her PET scan given her symptoms and schedule her a follow up with Dr. Rogue Bussing shortly thereafter to go over results. Labs today including CEA as she is overdue for this. Glad to hear PT and visit with Beckey Rutter was beneficial- she does not wish to have further follow up for this at this time. In terms of her port we discussed the importance of  port flushes. She declines today despite advisement. Will plan for port flush at her visit after PET.   # DISPOSITION: PET to be scheduled; labs today # follow up within 3- weeks- MD; PET scan prior, port flush- [place]  Orders Placed This Encounter  Procedures   CEA    Standing Status:   Future    Number of Occurrences:   1    Standing Expiration Date:   03/26/2023   Comprehensive metabolic panel    Standing Status:   Future    Number of Occurrences:   1    Standing Expiration Date:   03/26/2023   CBC    Standing Status:   Future    Number of Occurrences:   1    Standing Expiration Date:   03/26/2023   All questions were answered. The patient knows to call the clinic with any problems, questions or concerns.      Hughie Closs, PA-C 03/26/2022 3:59 PM

## 2022-03-28 LAB — CEA: CEA: 1.2 ng/mL (ref 0.0–4.7)

## 2022-04-12 ENCOUNTER — Other Ambulatory Visit: Payer: Self-pay | Admitting: Internal Medicine

## 2022-04-12 DIAGNOSIS — C210 Malignant neoplasm of anus, unspecified: Secondary | ICD-10-CM

## 2022-04-12 NOTE — Progress Notes (Signed)
Patient denied; ordered CT scans instead.  Follow-up 2-3 days later. GB

## 2022-04-19 ENCOUNTER — Other Ambulatory Visit: Payer: BC Managed Care – PPO

## 2022-04-19 ENCOUNTER — Ambulatory Visit: Payer: BC Managed Care – PPO | Admitting: Internal Medicine

## 2022-04-20 ENCOUNTER — Ambulatory Visit: Payer: BC Managed Care – PPO | Admitting: Internal Medicine

## 2022-04-23 ENCOUNTER — Ambulatory Visit: Admission: RE | Admit: 2022-04-23 | Payer: BC Managed Care – PPO | Source: Ambulatory Visit

## 2022-04-28 ENCOUNTER — Inpatient Hospital Stay: Payer: BC Managed Care – PPO | Attending: Internal Medicine | Admitting: Internal Medicine

## 2022-04-28 ENCOUNTER — Encounter: Payer: Self-pay | Admitting: Internal Medicine

## 2022-04-28 NOTE — Assessment & Plan Note (Deleted)
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  Finished radiation on Oct, 11th, 2022.  # plan get PET scan in 3 weeks [mid to late JAN 2023.]; ie- PET scan about 3 months post radiation; JAN 2023- IMPRESSION: 1. Interval resolution of previously demonstrated hypermetabolic activity at the anus. In addition, small hypermetabolic inguinal lymph nodes present on previous outside PET-CT have resolved. 2. No evidence of local recurrence or distant metastatic disease. 3. Stable right lung perifissural nodule, likely a lymph node.   Electronically Signed   By: Richardean Sale M.D.   On: 08/07/2021 13:32  # Radiation dermatitis- resolved.  STOP pyridium/trentral.   # Sexual dysfunction/painful coitus- recommend eva with [facility]- s/p  Evaluation  With Lauren...   # Throid nodule-6 mm incidental [PET aug 2022]-await repeat PET imaging as above/consider biopsy.  #Port flush-today; declines Emla cream.  # DISPOSITION: # port flush today # follow up in 3- weeks- MD; PET scan proir- Dr.B

## 2022-07-06 ENCOUNTER — Ambulatory Visit
Admission: RE | Admit: 2022-07-06 | Discharge: 2022-07-06 | Disposition: A | Discharge status: Home / Self Care | Payer: BC Managed Care – PPO | Source: Ambulatory Visit | Attending: Medical Oncology | Admitting: Medical Oncology

## 2022-07-06 ENCOUNTER — Other Ambulatory Visit: Payer: BC Managed Care – PPO

## 2022-07-06 DIAGNOSIS — Z923 Personal history of irradiation: Secondary | ICD-10-CM | POA: Diagnosis not present

## 2022-07-06 DIAGNOSIS — Z853 Personal history of malignant neoplasm of breast: Secondary | ICD-10-CM | POA: Insufficient documentation

## 2022-07-06 DIAGNOSIS — C210 Malignant neoplasm of anus, unspecified: Secondary | ICD-10-CM | POA: Insufficient documentation

## 2022-07-06 LAB — GLUCOSE, CAPILLARY: Glucose-Capillary: 103 mg/dL — ABNORMAL HIGH (ref 70–99)

## 2022-07-06 MED ORDER — FLUDEOXYGLUCOSE F - 18 (FDG) INJECTION
7.4600 | Freq: Once | INTRAVENOUS | Status: AC
  Administered 2022-07-06: 7.46 via INTRAVENOUS

## 2022-07-16 ENCOUNTER — Other Ambulatory Visit: Payer: Self-pay

## 2022-07-16 DIAGNOSIS — C210 Malignant neoplasm of anus, unspecified: Secondary | ICD-10-CM

## 2022-07-19 ENCOUNTER — Telehealth: Payer: Self-pay | Admitting: *Deleted

## 2022-07-19 NOTE — Telephone Encounter (Signed)
Yolanda Salisbury, PA sent mychart message to patient regarding PET results. Patient has follow up scheduled on 3/18 for labs and Dr. Rogue Bussing.    Fantastic news is that your PET scan looks good. No evidence of disease and the suspected lymph node that they have been watching is stable in size.

## 2022-09-21 ENCOUNTER — Inpatient Hospital Stay: Payer: Self-pay | Admitting: Internal Medicine

## 2022-09-21 ENCOUNTER — Encounter: Payer: Self-pay | Admitting: Internal Medicine

## 2022-09-21 ENCOUNTER — Inpatient Hospital Stay: Payer: Self-pay | Attending: Internal Medicine

## 2022-09-21 NOTE — Assessment & Plan Note (Deleted)
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  Finished radiation on Oct, 11th, 2022. DEC 2023-  No hypermetabolic residual/recurrent or metastatic disease; Ongoing stability of a 4 mm nodule along the right major fissure, most likely a benign subpleural lymph node...  # Radiation dermatitis- resolved.  STOP pyridium/trentral.   # Sexual dysfunction/painful coitus- recommend eva with [facility]- dsicussed with Lauren.   # Throid nodule-6 mm incidental [PET aug 2022]-await repeat PET imaging as above/consider biopsy.  #Port flush-today; declines Emla cream.  # DISPOSITION: # port flush today # follow up in 3- weeks- MD; PET scan proir- Dr.B

## 2022-09-27 ENCOUNTER — Other Ambulatory Visit: Payer: BC Managed Care – PPO

## 2022-09-27 ENCOUNTER — Ambulatory Visit: Payer: BC Managed Care – PPO | Admitting: Internal Medicine

## 2022-12-12 ENCOUNTER — Other Ambulatory Visit: Payer: Self-pay | Admitting: Internal Medicine

## 2022-12-12 DIAGNOSIS — F32A Depression, unspecified: Secondary | ICD-10-CM

## 2023-01-29 IMAGING — PT NM PET TUM IMG RESTAG (PS) SKULL BASE T - THIGH
1 of 9 series · 1 of 25 positions shown · non-contrast
Comparison: CTs of the chest, abdomen and pelvis 07/30/2021.
Outside PET-CT 01/16/2021 without report.

CLINICAL DATA: Subsequent treatment strategy for anal cancer.

EXAM:
NUCLEAR MEDICINE PET SKULL BASE TO THIGH
TECHNIQUE: 7.07 mCi F-18 FDG was injected intravenously. Full-ring PET imaging
was performed from the skull base to thigh after the radiotracer. CT
data was obtained and used for attenuation correction and anatomic
localization.
Fasting blood glucose: 98 mg/dl

[Series 3: ct wb 5.0 b30f · axial · 5.0mm · 0.98mm/px · 1 of 290 slices shown]
[im 290/290  brain]
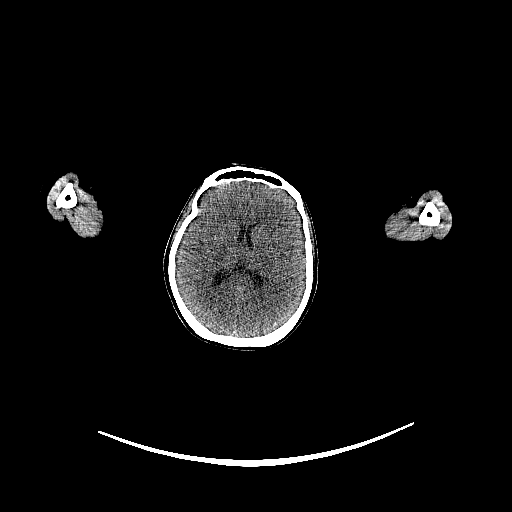

[1 of 25 positions shown; findings below may reference images not displayed]

FINDINGS: Mediastinal blood pool activity: SUV max

NECK:

No hypermetabolic cervical lymph nodes are identified.There are no
lesions of the pharyngeal mucosal space.

Incidental CT findings: none

CHEST:

There are no hypermetabolic mediastinal, hilar or axillary lymph
nodes. No hypermetabolic pulmonary activity or suspicious
nodularity.

Incidental CT findings: Right subclavian Port-A-Cath extends to the
upper right atrium. Stable 4 mm perifissural nodule along the right
major fissure. This appears unchanged from the baseline PET-CT and
is likely a benign lymph node. Surgical clips in the right axilla.
Bilateral breast implants.

ABDOMEN/PELVIS:

There is no hypermetabolic activity within the liver, adrenal
glands, spleen or pancreas. There is no hypermetabolic nodal
activity. There is no residual hypermetabolic activity at the anus.
The previously demonstrated hypermetabolic inguinal lymph nodes
bilaterally have resolved.

Incidental CT findings: Stable probable cyst peripherally in the
right hepatic lobe. Mildly prominent stool throughout the colon.

SKELETON:

There is no hypermetabolic activity to suggest osseous metastatic
disease.

Incidental CT findings: Stable fragmentation of the anterior
inferior iliac spine which may be posttraumatic.
IMPRESSION: 1. Interval resolution of previously demonstrated hypermetabolic
activity at the anus. In addition, small hypermetabolic inguinal
lymph nodes present on previous outside PET-CT have resolved.
2. No evidence of local recurrence or distant metastatic disease.
3. Stable right lung perifissural nodule, likely a lymph node.

## 2023-03-18 ENCOUNTER — Other Ambulatory Visit: Payer: Self-pay | Admitting: Internal Medicine

## 2023-03-18 DIAGNOSIS — F419 Anxiety disorder, unspecified: Secondary | ICD-10-CM

## 2023-06-21 ENCOUNTER — Other Ambulatory Visit: Payer: Self-pay | Admitting: Internal Medicine

## 2023-06-21 DIAGNOSIS — F419 Anxiety disorder, unspecified: Secondary | ICD-10-CM

## 2023-07-08 ENCOUNTER — Ambulatory Visit: Payer: Self-pay | Admitting: Internal Medicine

## 2023-07-08 ENCOUNTER — Encounter: Payer: Self-pay | Admitting: Internal Medicine

## 2023-07-08 VITALS — BP 130/80 | HR 92 | Ht 66.0 in | Wt 137.0 lb

## 2023-07-08 DIAGNOSIS — F419 Anxiety disorder, unspecified: Secondary | ICD-10-CM

## 2023-07-08 DIAGNOSIS — Z9011 Acquired absence of right breast and nipple: Secondary | ICD-10-CM

## 2023-07-08 DIAGNOSIS — C210 Malignant neoplasm of anus, unspecified: Secondary | ICD-10-CM

## 2023-07-08 DIAGNOSIS — F32A Depression, unspecified: Secondary | ICD-10-CM

## 2023-07-08 MED ORDER — VENLAFAXINE HCL ER 75 MG PO CP24: 75.0000 mg | ORAL_CAPSULE | Freq: Every day | ORAL | 3 refills | Status: DC

## 2023-07-08 MED ORDER — VENLAFAXINE HCL ER 75 MG PO CP24: 75.0000 mg | ORAL_CAPSULE | Freq: Every day | ORAL | 3 refills | Status: AC

## 2023-07-08 NOTE — Progress Notes (Signed)
Established Patient Office Visit  Subjective:  Patient ID: Yolanda Huerta, female    DOB: September 29, 1965  Age: 57 y.o. MRN: [id number]  Chief Complaint  Patient presents with   Follow-up    Rx refill    Patient not in since May 2023.  Reports that she ran out of insurance so stopped going to her physicians including her oncologist.  She has history of breast cancer and anal cancer.   Now she is trying to get a new insurance and will connect back for her screening tests.  However today she needs a refill on her venlafaxine for her anxiety. Patient will return for a complete physical once her insurance has been established. Generally feels well and has no new complaints.    No other concerns at this time.   Past Medical History:  Diagnosis Date   Anal squamous cell carcinoma ([facility])    Breast cancer ([facility])    age 72    Past Surgical History:  Procedure Laterality Date   complete hysterectomy     right mastectomy      Social History   Socioeconomic History   Marital status: Married    Spouse name: Not on file   Number of children: Not on file   Years of education: Not on file   Highest education level: Not on file  Occupational History   Not on file  Tobacco Use   Smoking status: Never    Passive exposure: Never   Smokeless tobacco: Never  Vaping Use   Vaping status: Never Used  Substance and Sexual Activity   Alcohol use: Not Currently   Drug use: Never   Sexual activity: Not on file  Other Topics Concern   Not on file  Social History Narrative   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    Social Drivers of Corporate investment banker Strain: Not on file  Food Insecurity: No Food Insecurity (02/23/2021)   Received from [facility], [facility]   Hunger Vital Sign    Worried About Running Out of Food in the Last Year: Never true    Ran Out of Food in the Last Year: Never true  Transportation Needs: No  Transportation Needs (02/23/2021)   Received from [facility], [facility]   PRAPARE - Transportation    Lack of Transportation (Medical): No    Lack of Transportation (Non-Medical): No  Physical Activity: Not on file  Stress: Not on file  Social Connections: Not on file  Intimate Partner Violence: Not on file    Family History  Problem Relation Age of Onset   Hypertension Mother    Cancer Father        laryngeal cancer   Cancer Maternal Aunt        GYN   Colon cancer Maternal Aunt     Allergies  Allergen Reactions   Sulfa Antibiotics Rash    Outpatient Medications Prior to Visit  Medication Sig   cholecalciferol (VITAMIN D3) 25 MCG (1000 UNIT) tablet Take 1,000 Units by mouth daily.   [DISCONTINUED] venlafaxine XR (EFFEXOR-XR) 75 MG 24 hr capsule TAKE 1 CAPSULE BY MOUTH DAILY   pentoxifylline (TRENTAL) 400 MG CR tablet Take 400 mg by mouth 3 (three) times daily. (Patient not taking: Reported on 07/08/2023)   phenazopyridine (PYRIDIUM) 100 MG tablet Take 1 tablet (100 mg total) by mouth 3 (three) times daily with meals. (Patient not taking: Reported on 07/08/2023)   triamcinolone ointment (  KENALOG) 0.5 % Apply 1 application topically 2 (two) times daily. (Patient not taking: Reported on 07/08/2023)   Vitamin E 270 MG (600 UNIT) CAPS Take 3 capsules by mouth daily. (Patient not taking: Reported on 07/08/2023)   [DISCONTINUED] venlafaxine (EFFEXOR) 75 MG tablet Take 75 mg by mouth daily. (Patient not taking: Reported on 07/08/2023)   No facility-administered medications prior to visit.    Review of Systems  Constitutional: Negative.  Negative for chills, fever, malaise/fatigue and weight loss.  HENT: Negative.  Negative for nosebleeds and sinus pain.   Eyes: Negative.   Respiratory: Negative.  Negative for cough and shortness of breath.   Cardiovascular: Negative.  Negative for chest pain, palpitations and leg swelling.  Gastrointestinal: Negative.  Negative for  abdominal pain, constipation, diarrhea, heartburn, nausea and vomiting.  Genitourinary: Negative.  Negative for dysuria and flank pain.  Musculoskeletal: Negative.  Negative for joint pain and myalgias.  Skin: Negative.   Neurological: Negative.  Negative for dizziness and headaches.  Endo/Heme/Allergies: Negative.   Psychiatric/Behavioral: Negative.  Negative for depression and suicidal ideas. The patient is not nervous/anxious.        Objective:   BP 130/80   Pulse 92   Wt 137 lb (62.1 kg)   SpO2 97%   BMI 22.11 kg/m   Vitals:   07/08/23 1158  BP: 130/80  Pulse: 92  Weight: 137 lb (62.1 kg)  SpO2: 97%    Physical Exam Vitals and nursing note reviewed.  Constitutional:      Appearance: Normal appearance.  HENT:     Head: Normocephalic and atraumatic.     Nose: Nose normal.     Mouth/Throat:     Mouth: Mucous membranes are moist.     Pharynx: Oropharynx is clear.  Eyes:     Conjunctiva/sclera: Conjunctivae normal.     Pupils: Pupils are equal, round, and reactive to light.  Cardiovascular:     Rate and Rhythm: Normal rate and regular rhythm.     Pulses: Normal pulses.     Heart sounds: Normal heart sounds. No murmur heard. Pulmonary:     Effort: Pulmonary effort is normal.     Breath sounds: Normal breath sounds. No wheezing.  Abdominal:     General: Bowel sounds are normal.     Palpations: Abdomen is soft.     Tenderness: There is no abdominal tenderness. There is no right CVA tenderness or left CVA tenderness.  Musculoskeletal:        General: Normal range of motion.     Cervical back: Normal range of motion.     Right lower leg: No edema.     Left lower leg: No edema.  Skin:    General: Skin is warm and dry.  Neurological:     General: No focal deficit present.     Mental Status: She is alert and oriented to person, place, and time.  Psychiatric:        Mood and Affect: Mood normal.        Behavior: Behavior normal.      No results found for any  visits on 07/08/23.  No results found for this or any previous visit (from the past 2160 hours).    Assessment & Plan:  Medication refilled. Patient will return for a complete physical. Needs to make an appointment with her oncologist. Problem List Items Addressed This Visit     Anxiety and depression - Primary   Relevant Medications   venlafaxine XR (EFFEXOR-XR) 75  MG 24 hr capsule   History of total mastectomy of right breast   Anal squamous cell carcinoma ([facility])    Return in about 3 months (around 10/06/2023).   Total time spent: 25 minutes  Margaretann Loveless, MD  07/08/2023   This document may have been prepared by [facility] Voice Recognition software and as such may include unintentional dictation errors.

## 2023-10-07 ENCOUNTER — Encounter: Payer: Self-pay | Admitting: Internal Medicine

## 2023-10-18 ENCOUNTER — Telehealth: Payer: Self-pay | Admitting: *Deleted

## 2023-10-18 NOTE — Telephone Encounter (Signed)
 Patient was last seen 03/26/22 with PA Clent Jacks. She has anal cancer. She is requesting an appt . For screening she says

## 2023-11-11 ENCOUNTER — Inpatient Hospital Stay: Payer: Self-pay | Attending: Internal Medicine | Admitting: Internal Medicine

## 2023-11-11 ENCOUNTER — Inpatient Hospital Stay: Payer: Self-pay

## 2023-11-11 ENCOUNTER — Encounter: Payer: Self-pay | Admitting: Internal Medicine

## 2023-11-11 ENCOUNTER — Telehealth: Payer: Self-pay | Admitting: *Deleted

## 2023-11-11 VITALS — BP 117/73 | HR 80 | Temp 98.3°F | Resp 20 | Ht 66.0 in | Wt 125.3 lb

## 2023-11-11 DIAGNOSIS — Z800 Family history of malignant neoplasm of digestive organs: Secondary | ICD-10-CM | POA: Diagnosis not present

## 2023-11-11 DIAGNOSIS — Z853 Personal history of malignant neoplasm of breast: Secondary | ICD-10-CM | POA: Insufficient documentation

## 2023-11-11 DIAGNOSIS — Z90710 Acquired absence of both cervix and uterus: Secondary | ICD-10-CM | POA: Insufficient documentation

## 2023-11-11 DIAGNOSIS — Z90722 Acquired absence of ovaries, bilateral: Secondary | ICD-10-CM | POA: Insufficient documentation

## 2023-11-11 DIAGNOSIS — Z923 Personal history of irradiation: Secondary | ICD-10-CM | POA: Diagnosis not present

## 2023-11-11 DIAGNOSIS — Z85048 Personal history of other malignant neoplasm of rectum, rectosigmoid junction, and anus: Secondary | ICD-10-CM | POA: Diagnosis present

## 2023-11-11 DIAGNOSIS — C210 Malignant neoplasm of anus, unspecified: Secondary | ICD-10-CM | POA: Diagnosis not present

## 2023-11-11 DIAGNOSIS — Z9221 Personal history of antineoplastic chemotherapy: Secondary | ICD-10-CM | POA: Diagnosis not present

## 2023-11-11 LAB — CBC WITH DIFFERENTIAL (CANCER CENTER ONLY)
Abs Immature Granulocytes: 0.01 10*3/uL (ref 0.00–0.07)
Basophils Absolute: 0 10*3/uL (ref 0.0–0.1)
Basophils Relative: 1 %
Eosinophils Absolute: 0.1 10*3/uL (ref 0.0–0.5)
Eosinophils Relative: 2 %
HCT: 40.6 % (ref 36.0–46.0)
Hemoglobin: 13.4 g/dL (ref 12.0–15.0)
Immature Granulocytes: 0 %
Lymphocytes Relative: 23 %
Lymphs Abs: 0.7 10*3/uL (ref 0.7–4.0)
MCH: 31.4 pg (ref 26.0–34.0)
MCHC: 33 g/dL (ref 30.0–36.0)
MCV: 95.1 fL (ref 80.0–100.0)
Monocytes Absolute: 0.5 10*3/uL (ref 0.1–1.0)
Monocytes Relative: 14 %
Neutro Abs: 2 10*3/uL (ref 1.7–7.7)
Neutrophils Relative %: 60 %
Platelet Count: 227 10*3/uL (ref 150–400)
RBC: 4.27 MIL/uL (ref 3.87–5.11)
RDW: 13.1 % (ref 11.5–15.5)
WBC Count: 3.3 10*3/uL — ABNORMAL LOW (ref 4.0–10.5)
nRBC: 0 % (ref 0.0–0.2)

## 2023-11-11 LAB — CMP (CANCER CENTER ONLY)
ALT: 20 U/L (ref 0–44)
AST: 25 U/L (ref 15–41)
Albumin: 4.3 g/dL (ref 3.5–5.0)
Alkaline Phosphatase: 65 U/L (ref 38–126)
Anion gap: 8 (ref 5–15)
BUN: 14 mg/dL (ref 6–20)
CO2: 28 mmol/L (ref 22–32)
Calcium: 9 mg/dL (ref 8.9–10.3)
Chloride: 101 mmol/L (ref 98–111)
Creatinine: 0.59 mg/dL (ref 0.44–1.00)
GFR, Estimated: >60 mL/min (ref >60)
Glucose, Bld: 81 mg/dL (ref 70–99)
Potassium: 3.8 mmol/L (ref 3.5–5.1)
Sodium: 137 mmol/L (ref 135–145)
Total Bilirubin: 1.5 mg/dL — ABNORMAL HIGH (ref 0.0–1.2)
Total Protein: 7.1 g/dL (ref 6.5–8.1)

## 2023-11-11 NOTE — Progress Notes (Signed)
 [facility] OFFICE PROGRESS NOTE  Patient Care Team: Pcp, No as PCP - General Gwyn Leos, MD as Consulting Physician (Internal Medicine)   Cancer Staging  No matching staging information was found for the patient.    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  Oncotype recurrence score 18; April 2007-tamoxifen plus Lupron.;  March 2008-CIN-1-total abdominal hysterectomy and bilateral salpingo-oophorectomy plus appendectomy.;  February 2008-Lupron discontinued; continue tamoxifen.  June 2017-2019-anastrozole; October 2019 stopped anastrozole-declining bone density.   Anal cancer (HCC)  05/08/2021 Initial Diagnosis   Anal cancer (HCC)      HPI: Alone.  Walking independently  Yolanda Huerta 58 y.o.  female pleasant patient above history of  anal cancer stage III status post chemoradiation [OCT 2022] is here for follow-up.  Patient was also follow-up because as she lost insurance.  She is back reestablish care.  Patient was last seen 03/26/2022, states she still has her port.  Not flushed.  Denies any abdominal pain.  No nausea or vomiting.  Denies any leg swelling.  Denies any new lumps or bumps.  Intermittent bloody stools chronic.  Not any worse.   Otherwise patient has no concerns today.    Review of Systems  Constitutional:  Positive for malaise/fatigue. Negative for chills, diaphoresis, fever and weight loss.  HENT:  Negative for nosebleeds and sore throat.   Eyes:  Negative for double vision.   Respiratory:  Negative for cough, hemoptysis, sputum production, shortness of breath and wheezing.   Cardiovascular:  Negative for chest pain, palpitations, orthopnea and leg swelling.  Gastrointestinal:  Negative for abdominal pain, constipation, diarrhea, heartburn, melena, nausea and vomiting.  Musculoskeletal:  Negative for back pain and joint pain.  Skin: Negative.  Negative for itching and rash.  Neurological:  Negative for dizziness, tingling, focal weakness, weakness and headaches.  Endo/Heme/Allergies:  Does not bruise/bleed easily.  Psychiatric/Behavioral:  Negative for depression. The patient is not nervous/anxious and does not have insomnia.       PAST MEDICAL HISTORY :  Past Medical History:  Diagnosis Date   Anal squamous cell carcinoma (HCC)    Breast cancer (HCC)    age 32    PAST SURGICAL HISTORY :   Past Surgical History:  Procedure Laterality Date   complete hysterectomy     right mastectomy      FAMILY HISTORY :   Family History  Problem Relation Age of Onset   Hypertension Mother    Cancer Father        laryngeal cancer   Cancer Maternal Aunt        GYN   Colon cancer Maternal Aunt     SOCIAL HISTORY:   Social History   Tobacco Use   Smoking status: Never    Passive exposure: Never   Smokeless tobacco: Never  Vaping Use   Vaping status: Never Used  Substance Use Topics   Alcohol use: Not Currently   Drug use: Never    ALLERGIES:  is allergic to sulfa antibiotics.  MEDICATIONS:  Current Outpatient Medications  Medication Sig Dispense Refill   cholecalciferol (VITAMIN D3) 25 MCG (1000 UNIT) tablet Take 1,000  Units by mouth daily.     venlafaxine  XR (EFFEXOR -XR) 75 MG 24 hr capsule Take 1 capsule (75 mg total) by mouth daily. 90 capsule 3   pentoxifylline (TRENTAL) 400 MG CR tablet Take 400 mg by mouth 3 (three) times daily. (Patient not taking: Reported on 07/10/2021)     phenazopyridine  (PYRIDIUM ) 100 MG tablet Take 1 tablet (100 mg  total) by mouth 3 (three) times daily with meals. (Patient not taking: Reported on 07/10/2021) 30 tablet 1   triamcinolone  ointment (KENALOG ) 0.5 % Apply 1 application topically 2 (two) times daily. (Patient not taking: Reported on 07/10/2021) 30 g 0   Vitamin E 270 MG (600 UNIT) CAPS Take 3 capsules by mouth daily. (Patient not taking: Reported on 08/14/2021)     No current facility-administered medications for this visit.    PHYSICAL EXAMINATION: ECOG PERFORMANCE STATUS: 1 - Symptomatic but completely ambulatory  BP 117/73 (BP Location: Left Arm, Patient Position: Sitting, Cuff Size: Normal)   Pulse 80   Temp 98.3 F (36.8 C) (Tympanic)   Resp 20   Ht 5\' 6"  (1.676 m)   Wt 125 lb 4.8 oz (56.8 kg)   SpO2 100%   BMI 20.22 kg/m   Filed Weights   11/11/23 1020  Weight: 125 lb 4.8 oz (56.8 kg)    Physical Exam Vitals and nursing note reviewed.  HENT:     Head: Normocephalic and atraumatic.     Mouth/Throat:     Pharynx: Oropharynx is clear.  Eyes:     Extraocular Movements: Extraocular movements intact.     Pupils: Pupils are equal, round, and reactive to light.  Cardiovascular:     Rate and Rhythm: Normal rate and regular rhythm.  Abdominal:     Palpations: Abdomen is soft.  Musculoskeletal:        General: Normal range of motion.     Cervical back: Normal range of motion.  Skin:    General: Skin is warm.  Neurological:     General: No focal deficit present.     Mental Status: She is alert and oriented to person, place, and time.  Psychiatric:        Behavior: Behavior normal.        Judgment: Judgment normal.        LABORATORY DATA:  I have reviewed the data as listed    Component Value Date/Time   NA 137 11/11/2023 1101   K 3.8 11/11/2023 1101   CL 101 11/11/2023 1101   CO2 28 11/11/2023 1101   GLUCOSE 81 11/11/2023 1101   BUN 14 11/11/2023 1101   CREATININE 0.59 11/11/2023 1101   CALCIUM 9.0 11/11/2023 1101   PROT 7.1 11/11/2023 1101   ALBUMIN 4.3  11/11/2023 1101   AST 25 11/11/2023 1101   ALT 20 11/11/2023 1101   ALKPHOS 65 11/11/2023 1101   BILITOT 1.5 (H) 11/11/2023 1101   GFRNONAA >60 11/11/2023 1101    No results found for: "SPEP", "UPEP"  Lab Results  Component Value Date   WBC 3.3 (L) 11/11/2023   NEUTROABS 2.0 11/11/2023   HGB 13.4 11/11/2023   HCT 40.6 11/11/2023   MCV 95.1 11/11/2023   PLT 227 11/11/2023      Chemistry      Component Value Date/Time   NA 137 11/11/2023 1101   K 3.8 11/11/2023 1101   CL 101 11/11/2023 1101   CO2 28 11/11/2023 1101   BUN 14 11/11/2023 1101   CREATININE 0.59 11/11/2023 1101  Component Value Date/Time   CALCIUM 9.0 11/11/2023 1101   ALKPHOS 65 11/11/2023 1101   AST 25 11/11/2023 1101   ALT 20 11/11/2023 1101   BILITOT 1.5 (H) 11/11/2023 1101       RADIOGRAPHIC STUDIES: I have personally reviewed the radiological images as listed and agreed with the findings in the report. No results found.   ASSESSMENT & PLAN:  Anal cancer (HCC) #Anal cancer stage III [cT1cN1; [place] ]-s/p concurrent chemoradiation with continuous 5-FU infusion plus mitomycin day 1 day 29.  Finished radiation on Oct, 11th, 2022.-PET DEC 2023-  No hypermetabolic residual/recurrent or metastatic disease. 2. Ongoing stability of a 4 mm nodule along the right major fissure, most likely a benign subpleural lymph node. referral to [facility]- GI re: Hx of anal cancer-screening.  Will plan to get a CT scan chest and pelvis for surveillance.  # Intermittent blood in stool suspect radiation-induced-do not suspect any ongoing malignancy.  Check labs refer to GI as above.  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 Oncotype recurrence score 18-no chemotherapy.  S/p endocrine therapy finished October 2019.  Order unilateral left mammogram.  #  March 2008-CIN-1-total abdominal hysterectomy and  bilateral salpingo-oophorectomy-follow-up with gynecology.  #Port flush- # port/IV access-Discussed re: pro and cons of keeping the port vs. Explantation.  HOLD Referral to IR re: mediport explantation until CT scan.   CT-port- myhcart-  # DISPOSITION: # referral to [facility]- GI re: Hx of anal cancer-screening colonospcoy # LEFT UNILATERAL screening # CT scan CAP in 1 week-  [facility] # labs- today- order CBC/MP # follow up  TBD- Dr.B    Orders Placed This Encounter  Procedures   CT CHEST ABDOMEN PELVIS W CONTRAST    Standing Status:   Future    Expected Date:   11/18/2023    Expiration Date:   11/10/2024    If indicated for the ordered procedure, I authorize the administration of contrast media per Radiology protocol:   Yes    Does the patient have a contrast media/X-ray dye allergy?:   No    Preferred imaging location?:   [facility]    If indicated for the ordered procedure, I authorize the administration of oral contrast media per Radiology protocol:   Yes    Is patient pregnant?:   No   MM 3D SCREENING MAMMOGRAM UNILATERAL LEFT BREAST    Standing Status:   Future    Expiration Date:   11/10/2024    Reason for Exam (SYMPTOM  OR DIAGNOSIS REQUIRED):   history of right breast cancer    Preferred imaging location?:   [facility]    Is the patient pregnant?:   No   CBC with Differential (Cancer Center Only)    Standing Status:   Future    Number of Occurrences:   1    Expected Date:   11/11/2023    Expiration Date:   11/10/2024   CMP (Cancer Center only)    Standing Status:   Future    Number of Occurrences:   1    Expected Date:   11/11/2023    Expiration Date:   11/10/2024   Ambulatory referral to Gastroenterology    Referral Priority:   Routine    Referral Type:   Consultation    Referral Reason:   Specialty Services Required    Number of Visits Requested:   1   All  questions were answered. The patient knows to call the clinic with any problems, questions or concerns.      Gwyn Leos, MD 11/11/2023 11:41 AM

## 2023-11-11 NOTE — Assessment & Plan Note (Addendum)
#  Anal cancer stage III [cT1cN1; [place] ]-s/p concurrent chemoradiation with continuous 5-FU infusion plus mitomycin day 1 day 29.  Finished radiation on Oct, 11th, 2022.-PET DEC 2023-  No hypermetabolic residual/recurrent or metastatic disease. 2. Ongoing stability of a 4 mm nodule along the right major fissure, most likely a benign subpleural lymph node. referral to [facility]- GI re: Hx of anal cancer-screening.  Will plan to get a CT scan chest and pelvis for surveillance.  # Intermittent blood in stool suspect radiation-induced-do not suspect any ongoing malignancy.  Check labs refer to GI as above.  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 Oncotype recurrence score 18-no chemotherapy.  S/p endocrine therapy finished October 2019.  Order unilateral left mammogram.  #  March 2008-CIN-1-total abdominal hysterectomy and bilateral salpingo-oophorectomy-follow-up with gynecology.  #Port flush- # port/IV access-Discussed re: pro and cons of keeping the port vs. Explantation.  HOLD Referral to IR re: mediport explantation until CT scan.   CT-port- myhcart-  # DISPOSITION: # referral to [facility]- GI re: Hx of anal cancer-screening colonospcoy # LEFT UNILATERAL screening # CT scan CAP in 1 week-  [facility] # labs- today- order CBC/MP # follow up  TBD- Dr.B

## 2023-11-11 NOTE — Telephone Encounter (Signed)
 Referral send via fax to [facility].  Fax [phone].

## 2023-11-11 NOTE — Progress Notes (Signed)
 Pt last seen 03/26/2022, states she still has her port.  Pt states she has no concerns today.

## 2023-11-15 ENCOUNTER — Telehealth: Payer: Self-pay | Admitting: Internal Medicine

## 2023-11-15 NOTE — Telephone Encounter (Signed)
 Called patient left voicemail to call [facility] for CT scan. Left number and directions for getting through. [phone] and press 1 for scheduling.

## 2023-11-24 ENCOUNTER — Telehealth: Payer: Self-pay

## 2023-11-24 NOTE — Telephone Encounter (Signed)
 I spoke with Megan at [facility], pt has appt with Laquetta Plank 03/26/24 at 10:30 am.

## 2023-12-09 ENCOUNTER — Inpatient Hospital Stay: Admission: RE | Admit: 2023-12-09 | Source: Ambulatory Visit

## 2023-12-09 ENCOUNTER — Ambulatory Visit
Admission: RE | Admit: 2023-12-09 | Discharge: 2023-12-09 | Disposition: A | Discharge status: Home / Self Care | Source: Ambulatory Visit | Attending: Internal Medicine | Admitting: Internal Medicine

## 2023-12-09 ENCOUNTER — Other Ambulatory Visit

## 2023-12-09 ENCOUNTER — Other Ambulatory Visit: Payer: Self-pay | Admitting: Internal Medicine

## 2023-12-09 ENCOUNTER — Ambulatory Visit

## 2023-12-09 DIAGNOSIS — Z853 Personal history of malignant neoplasm of breast: Secondary | ICD-10-CM

## 2023-12-09 DIAGNOSIS — C210 Malignant neoplasm of anus, unspecified: Secondary | ICD-10-CM

## 2023-12-09 MED ORDER — IOHEXOL 300 MG/ML  SOLN
100.0000 mL | Freq: Once | INTRAMUSCULAR | Status: AC | PRN
  Administered 2023-12-09: 100 mL via INTRAVENOUS

## 2023-12-12 ENCOUNTER — Other Ambulatory Visit: Payer: Self-pay | Admitting: *Deleted

## 2023-12-12 ENCOUNTER — Ambulatory Visit: Payer: Self-pay | Admitting: Internal Medicine

## 2023-12-12 DIAGNOSIS — C210 Malignant neoplasm of anus, unspecified: Secondary | ICD-10-CM

## 2023-12-12 NOTE — Telephone Encounter (Signed)
 I called the patient regarding the results CT scan-n no evidence of malignancy.  Lung nodules likely benign given stability.  Bladder stranding-prior radiation no symptoms at this time.  Slight chronic dilatation of the biliary ducts-slightly abnormal bilirubin of 1.5 with normal LFTs.  Clinically not suggestive of any significant concern.  Patient asymptomatic.  Recommend repeat follow-up in 3 months. Discuss port explant.  # Recommend follow-up with GI regarding-endoscopies.  Referral sent earlier.  # Please schedule follow-up-MD labs CBC CMP; port flush-  GB

## 2023-12-12 NOTE — Progress Notes (Signed)
 Appointments scheduled as requested and mychart message sent

## 2023-12-12 NOTE — Progress Notes (Signed)
 LABS UPDATED

## 2024-03-13 ENCOUNTER — Inpatient Hospital Stay: Admitting: Internal Medicine

## 2024-03-13 ENCOUNTER — Inpatient Hospital Stay

## 2024-08-01 ENCOUNTER — Telehealth: Payer: Self-pay | Admitting: *Deleted

## 2024-08-01 NOTE — Telephone Encounter (Signed)
 Caller verified using pt's full name and dob prior to discussing PHI    Patient would like to know if Dr. B would be able to offer NAVDX testing for her h/o HPV positive anal cancer. She stated that this is a DNA test that help aid a predictability score whether her anal cancer would reoccur. I told her that I would reach out to the team and discuss this with Dr. Rennie and get back with her with this recommendations.  She also requested an apt with Tinnie Dawn, NP to provide recommendations for dyspareunia or a referral to GYN. She has tried vaginal dilators and pelvic PT in the past. Sexual relations has been come very painful with she and her current partner. She would like to get on Lauren's schedule if possible. She prefers Friday afternoons around 3-next available with Lauren.

## 2024-08-08 ENCOUNTER — Telehealth: Payer: Self-pay | Admitting: *Deleted

## 2024-08-08 NOTE — Telephone Encounter (Addendum)
 Caller verified full name and dob.  She left a vm to request an apt with Tinnie Dawn for dyspareunia and apts with Dr. B to discuss NAVDX testing.  Per last phone message - Dr. KATHEE- requested an apt see Dr B in the next couple of weeks. Lab/See MD.  Per Dr. [place], pt needs to see Dr. B first since she missed her last apts with Dr. KATHEE.  I did return patient's phone call and left a vm acknowledged msg. I reviewed apts - the pt is scheduled in March. [place]- can we see about moving this apt up.

## 2024-09-21 ENCOUNTER — Inpatient Hospital Stay

## 2024-09-21 ENCOUNTER — Inpatient Hospital Stay: Admitting: Internal Medicine
